# Patient Record
Sex: Female | Born: 1970 | Race: Black or African American | Hispanic: No | Smoking: Former smoker
Health system: Southern US, Community
[De-identification: ages and names within clinical notes are randomized; demographics above are authoritative.]

## PROBLEM LIST (undated history)

## (undated) DIAGNOSIS — M2550 Pain in unspecified joint: Secondary | ICD-10-CM

## (undated) DIAGNOSIS — R109 Unspecified abdominal pain: Secondary | ICD-10-CM

## (undated) DIAGNOSIS — I10 Essential (primary) hypertension: Secondary | ICD-10-CM

## (undated) DIAGNOSIS — M549 Dorsalgia, unspecified: Secondary | ICD-10-CM

## (undated) DIAGNOSIS — F329 Major depressive disorder, single episode, unspecified: Secondary | ICD-10-CM

## (undated) DIAGNOSIS — R12 Heartburn: Secondary | ICD-10-CM

## (undated) DIAGNOSIS — M25569 Pain in unspecified knee: Secondary | ICD-10-CM

## (undated) DIAGNOSIS — R5383 Other fatigue: Secondary | ICD-10-CM

## (undated) DIAGNOSIS — F32A Depression, unspecified: Secondary | ICD-10-CM

## (undated) DIAGNOSIS — F419 Anxiety disorder, unspecified: Secondary | ICD-10-CM

## (undated) DIAGNOSIS — E669 Obesity, unspecified: Secondary | ICD-10-CM

## (undated) HISTORY — DX: Anxiety disorder, unspecified: F41.9

## (undated) HISTORY — DX: Other fatigue: R53.83

## (undated) HISTORY — DX: Dorsalgia, unspecified: M54.9

## (undated) HISTORY — DX: Essential (primary) hypertension: I10

## (undated) HISTORY — DX: Pain in unspecified knee: M25.569

## (undated) HISTORY — DX: Depression, unspecified: F32.A

## (undated) HISTORY — DX: Obesity, unspecified: E66.9

## (undated) HISTORY — PX: NO PAST SURGERIES: SHX2092

## (undated) HISTORY — DX: Unspecified abdominal pain: R10.9

## (undated) HISTORY — DX: Pain in unspecified joint: M25.50

## (undated) HISTORY — DX: Heartburn: R12

---
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
## 1998-05-05 ENCOUNTER — Emergency Department (HOSPITAL_COMMUNITY): Admission: EM | Admit: 1998-05-05 | Discharge: 1998-05-05 | Payer: Self-pay | Admitting: Emergency Medicine

## 1998-07-06 ENCOUNTER — Emergency Department (HOSPITAL_COMMUNITY): Admission: EM | Admit: 1998-07-06 | Discharge: 1998-07-06 | Payer: Self-pay | Admitting: Emergency Medicine

## 2000-07-18 ENCOUNTER — Emergency Department (HOSPITAL_COMMUNITY): Admission: EM | Admit: 2000-07-18 | Discharge: 2000-07-18 | Payer: Self-pay | Admitting: Emergency Medicine

## 2002-01-16 ENCOUNTER — Other Ambulatory Visit: Admission: RE | Admit: 2002-01-16 | Discharge: 2002-01-16 | Payer: Self-pay | Admitting: *Deleted

## 2002-01-16 ENCOUNTER — Encounter (INDEPENDENT_AMBULATORY_CARE_PROVIDER_SITE_OTHER): Payer: Self-pay | Admitting: *Deleted

## 2002-01-16 ENCOUNTER — Encounter: Admission: RE | Admit: 2002-01-16 | Discharge: 2002-01-16 | Payer: Self-pay | Admitting: *Deleted

## 2002-02-06 ENCOUNTER — Encounter: Admission: RE | Admit: 2002-02-06 | Discharge: 2002-02-06 | Payer: Self-pay | Admitting: *Deleted

## 2004-03-11 ENCOUNTER — Other Ambulatory Visit: Admission: RE | Admit: 2004-03-11 | Discharge: 2004-03-11 | Payer: Self-pay | Admitting: Obstetrics and Gynecology

## 2004-04-07 ENCOUNTER — Ambulatory Visit (HOSPITAL_COMMUNITY): Admission: RE | Admit: 2004-04-07 | Discharge: 2004-04-07 | Payer: Self-pay | Admitting: Obstetrics and Gynecology

## 2004-05-07 ENCOUNTER — Ambulatory Visit (HOSPITAL_COMMUNITY): Admission: RE | Admit: 2004-05-07 | Discharge: 2004-05-07 | Payer: Self-pay | Admitting: Obstetrics and Gynecology

## 2004-07-16 ENCOUNTER — Ambulatory Visit (HOSPITAL_COMMUNITY): Admission: RE | Admit: 2004-07-16 | Discharge: 2004-07-16 | Payer: Self-pay | Admitting: Obstetrics and Gynecology

## 2004-08-06 ENCOUNTER — Ambulatory Visit (HOSPITAL_COMMUNITY): Admission: RE | Admit: 2004-08-06 | Discharge: 2004-08-06 | Payer: Self-pay | Admitting: Obstetrics and Gynecology

## 2004-08-11 ENCOUNTER — Ambulatory Visit: Payer: Self-pay | Admitting: Family Medicine

## 2004-08-14 ENCOUNTER — Ambulatory Visit: Payer: Self-pay | Admitting: Obstetrics & Gynecology

## 2004-08-18 ENCOUNTER — Ambulatory Visit: Payer: Self-pay | Admitting: *Deleted

## 2004-08-21 ENCOUNTER — Ambulatory Visit: Payer: Self-pay | Admitting: Obstetrics & Gynecology

## 2004-08-25 ENCOUNTER — Ambulatory Visit: Payer: Self-pay | Admitting: *Deleted

## 2004-08-27 ENCOUNTER — Ambulatory Visit (HOSPITAL_COMMUNITY): Admission: RE | Admit: 2004-08-27 | Discharge: 2004-08-27 | Payer: Self-pay | Admitting: Obstetrics and Gynecology

## 2004-09-01 ENCOUNTER — Ambulatory Visit: Payer: Self-pay | Admitting: *Deleted

## 2004-09-03 ENCOUNTER — Ambulatory Visit: Payer: Self-pay | Admitting: Obstetrics & Gynecology

## 2004-09-10 ENCOUNTER — Ambulatory Visit: Payer: Self-pay | Admitting: Obstetrics & Gynecology

## 2004-09-13 ENCOUNTER — Inpatient Hospital Stay (HOSPITAL_COMMUNITY): Admission: AD | Admit: 2004-09-13 | Discharge: 2004-09-19 | Payer: Self-pay | Admitting: Obstetrics and Gynecology

## 2004-09-16 ENCOUNTER — Encounter (INDEPENDENT_AMBULATORY_CARE_PROVIDER_SITE_OTHER): Payer: Self-pay | Admitting: Specialist

## 2005-10-13 IMAGING — US US OB COMP +14 WK
1 series · 13 of 28 positions shown · non-contrast
Comparison: none

CLINICAL DATA: Second trimester bleeding.

[Series 1: us ob comp +14 wk · 0.31mm/px · 13 of 47 slices shown]
[im 2/47]
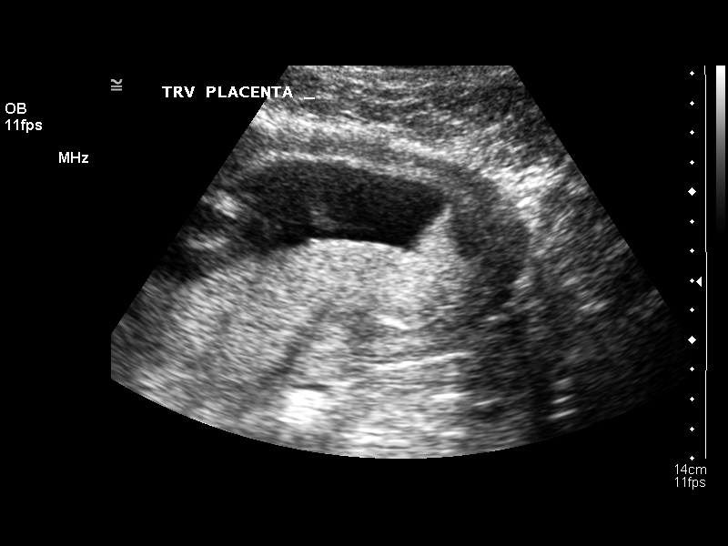
[im 6/47]
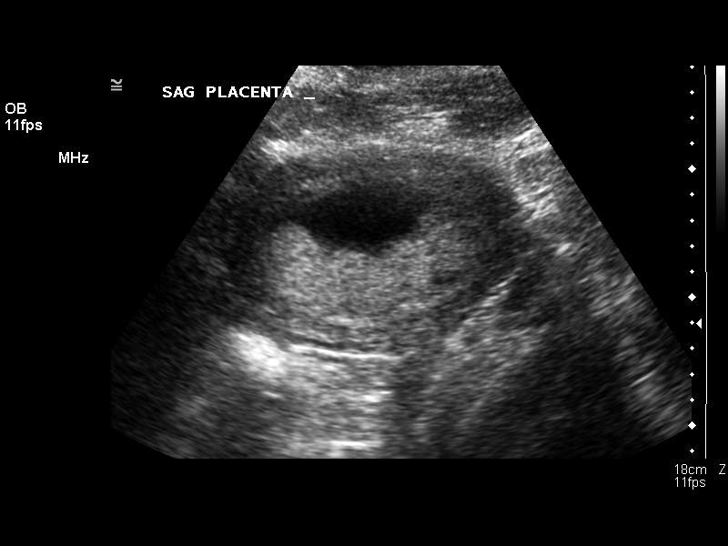
[im 9/47]
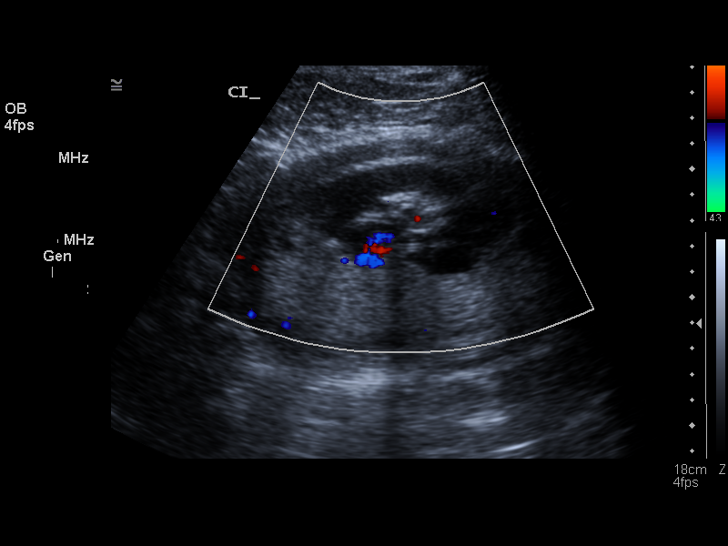
[im 12/47]
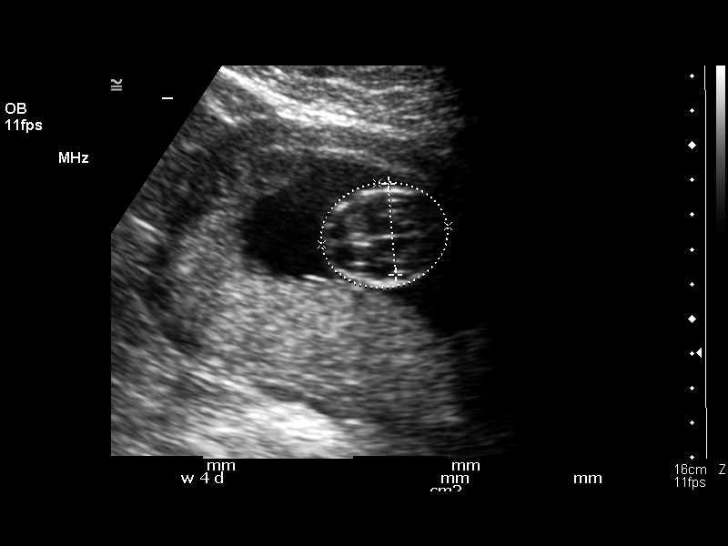
[im 16/47]
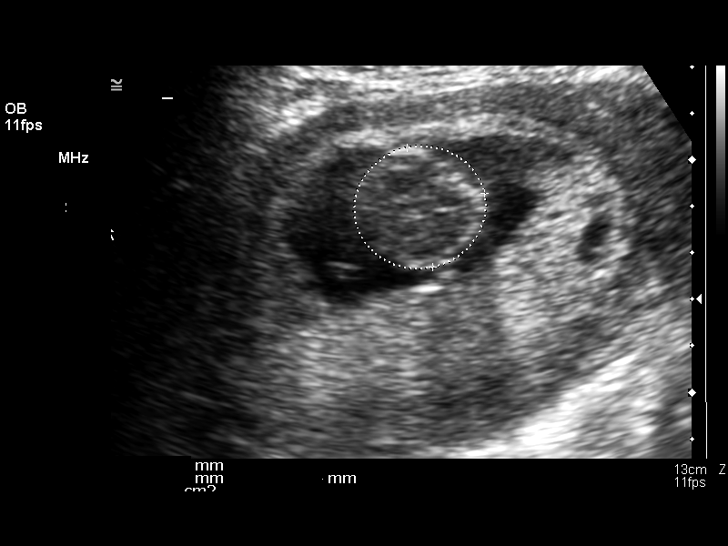
[im 19/47]
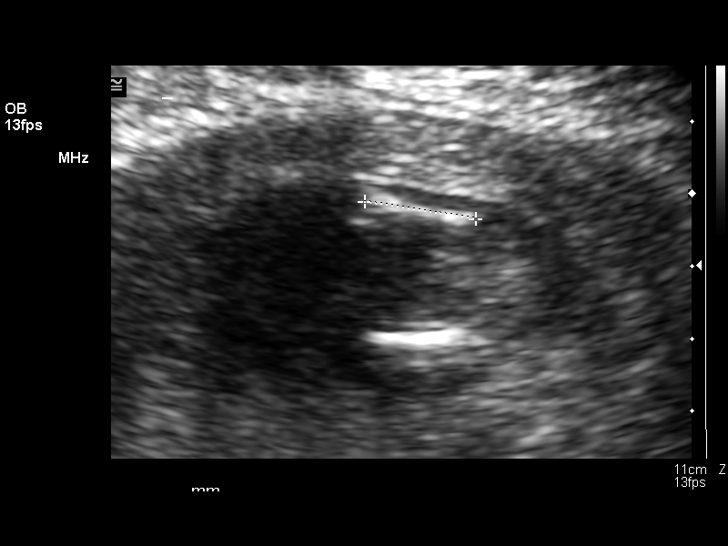
[im 24/47]
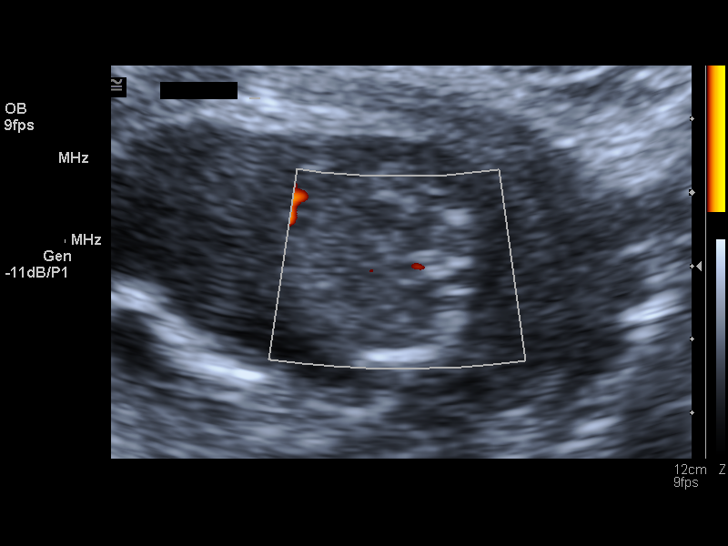
[im 28/47]
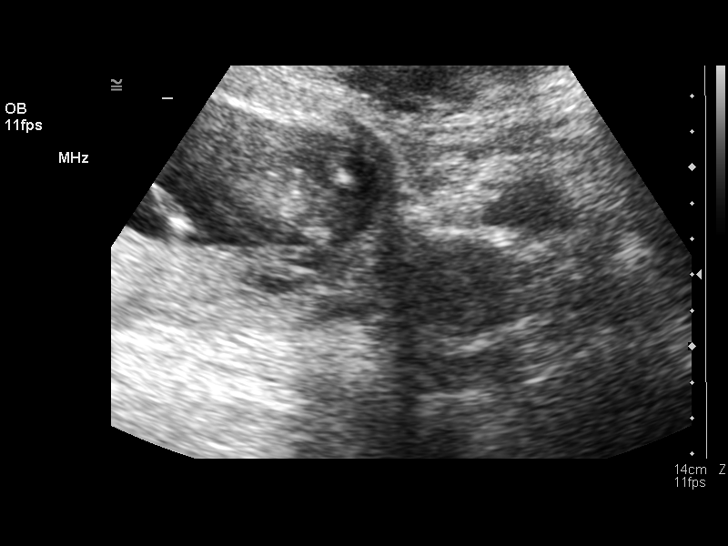
[im 31/47]
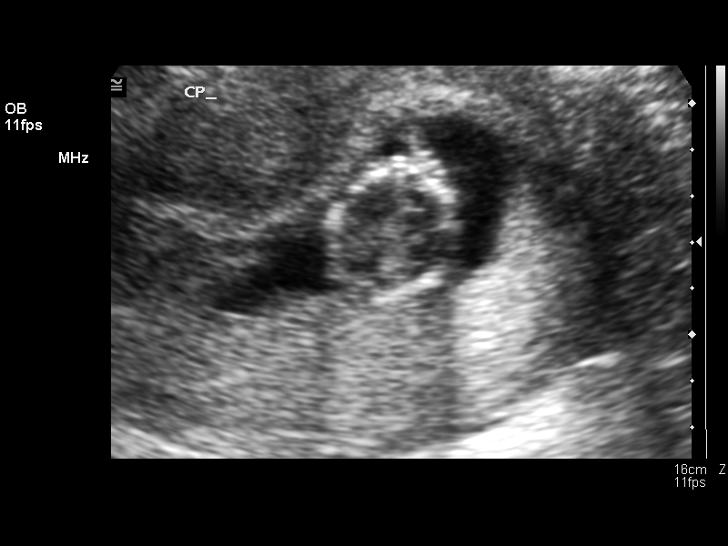
[im 35/47]
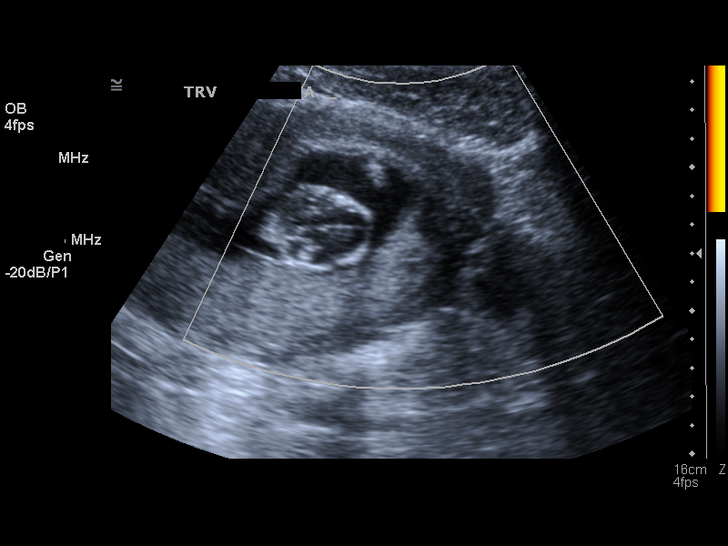
[im 38/47]
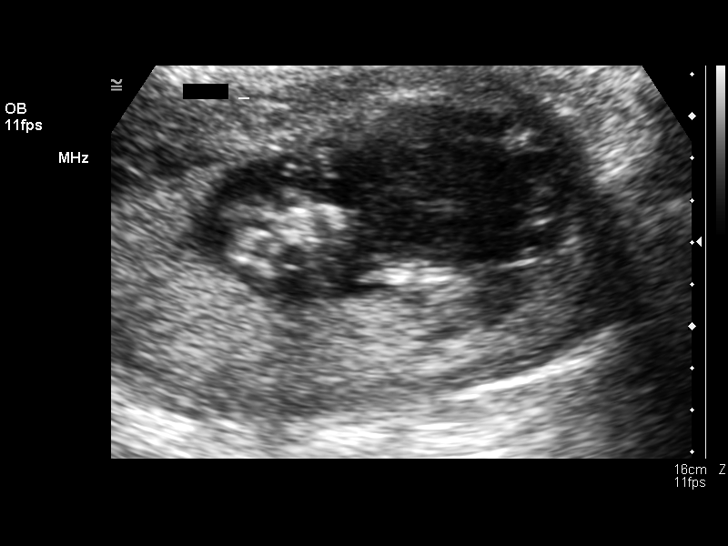
[im 41/47]
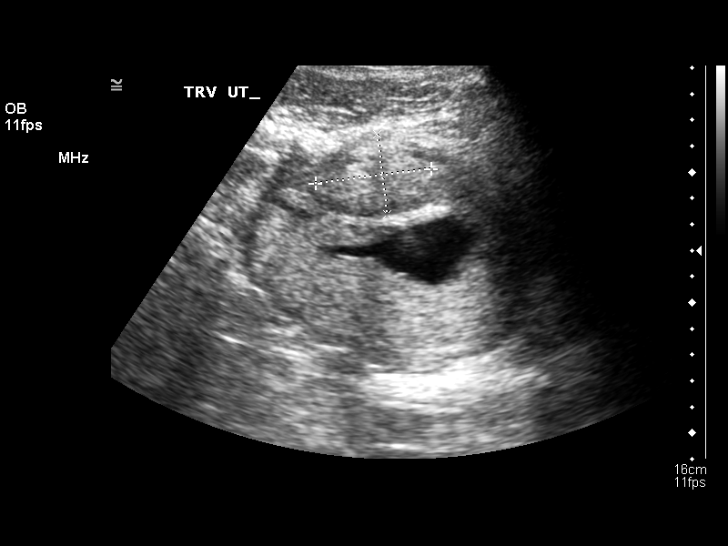
[im 45/47]
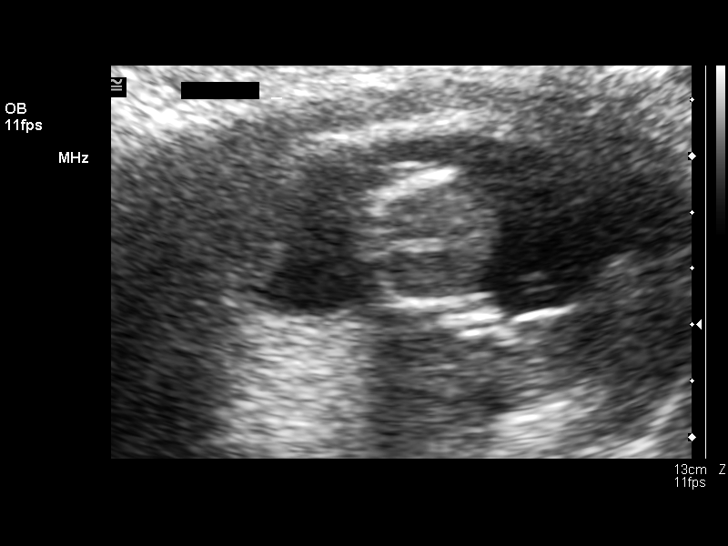

[13 of 28 positions shown; findings below may reference images not displayed]

OBSTETRICAL ULTRASOUND
 Number of Fetuses: 1
 Heart Rate:  157
 Movement:  Yes
 Breathing:  No  
 Presentation:  Transverse with head maternal right
 Placental Location:  Posterior
 Grade:  I
 Amniotic Fluid (Subjective):  Normal
 Amniotic Fluid (Objective):   4.2 cm Vertical pocket 

 FETAL BIOMETRY
 BPD:   2.6 cm   14 w 4 d
 HC:   10.6 cm   15 w 0 d
 AC:   9.0 cm   15 w 1 d
 FL:    1.5 cm   14 w 4 d

 MEAN GA:  14 w 6 d

 FETAL ANATOMY
 Lateral Ventricles:    Not visualized 
 Thalami/CSP:      Not visualized 
 Posterior Fossa:  Not visualized 
 Nuchal Region:    Not visualized 
 Spine:      Not visualized 
 4 Chamber Heart on Left:      Not visualized 
 Stomach on Left:      Visualized 
 3 Vessel Cord:    Not visualized 
 Cord Insertion site:    Not visualized 
 Kidneys:  Visualized 
 Bladder:  Visualized 
 Extremities:      Not visualized 

 ADDITIONAL ANATOMY VISUALIZED:  Diaphragm.

 Evaluation limited by:  Maternal habitus

 MATERNAL FINDINGS
 Cervix:   3.9 cm Transabdominally.  
 Anterior fibroid measuring 4.5 x 3.1 x 3.9 cm
IMPRESSION: Single living intrauterine gestation with subjectively normal amniotic fluid volume.  The mean estimated gestational age by fetal biometry is 14 weeks 6 days which correlates closely with the reported assigned gestational age by first ultrasound.  
 Visualized fetal anatomy is limited by maternal habitus and early gestational age.  
 Small hypoechoic focus noted at the inferior margin of the placenta is nonspecific, but may be related to a small marginal hematoma.  
 4.5 cm apparent fibroid in the anterior uterine body.  
 Repeat ultrasound exam in 3 ? 4 weeks is recommended for more complete characterization of fetal anatomy.  

 </u12:p>

## 2006-01-21 IMAGING — US US OB FOLLOW-UP
1 series · 13 of 28 positions shown · non-contrast
Comparison: none

CLINICAL DATA: Assess growth.

[Series 1: us ob follow-up · 0.27mm/px · 13 of 49 slices shown]
[im 2/49]
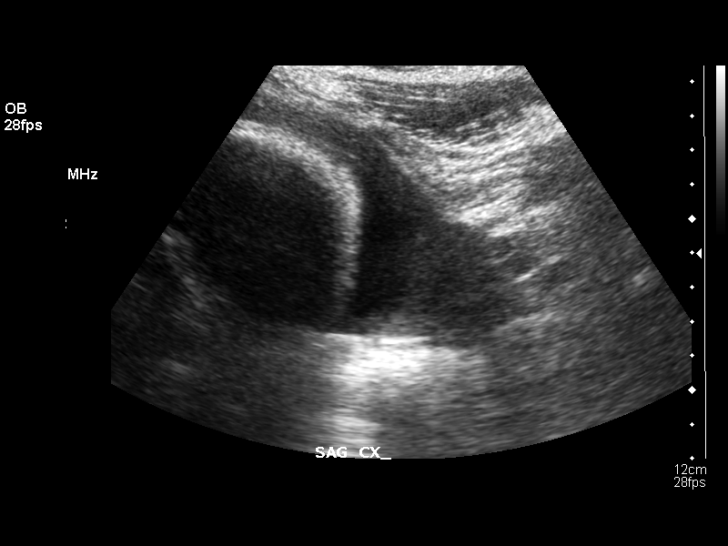
[im 6/49]
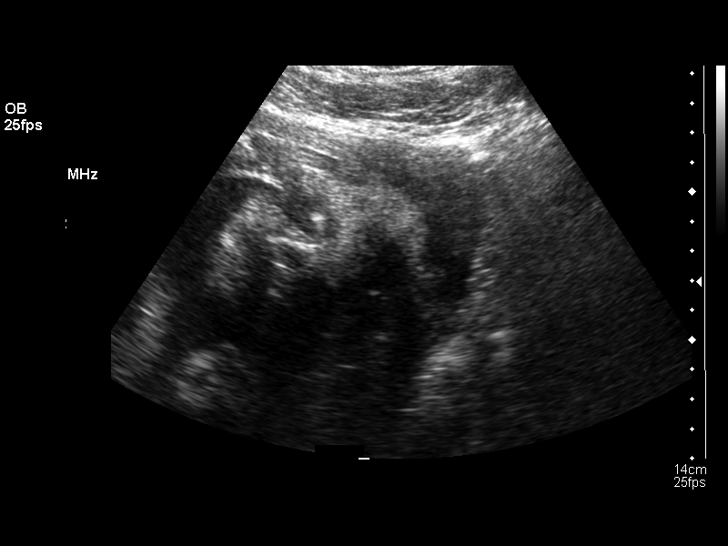
[im 9/49]
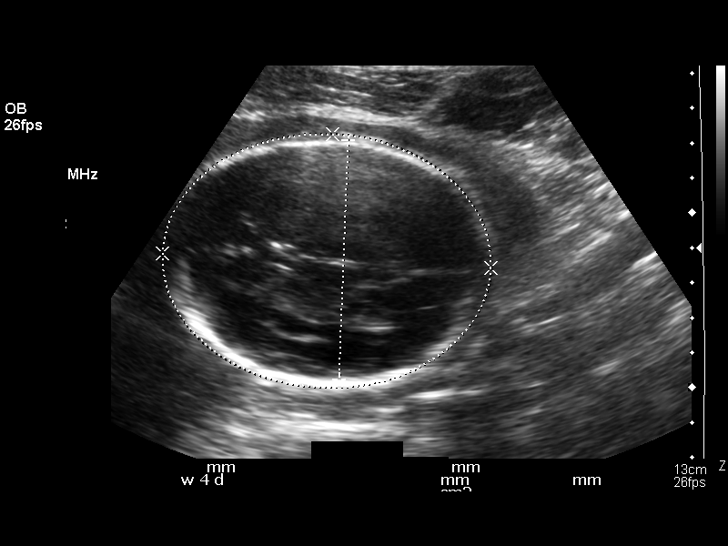
[im 13/49]
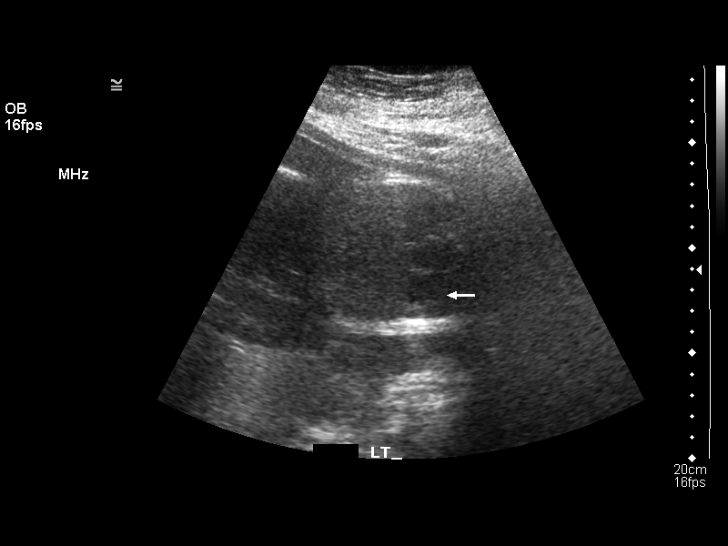
[im 17/49]
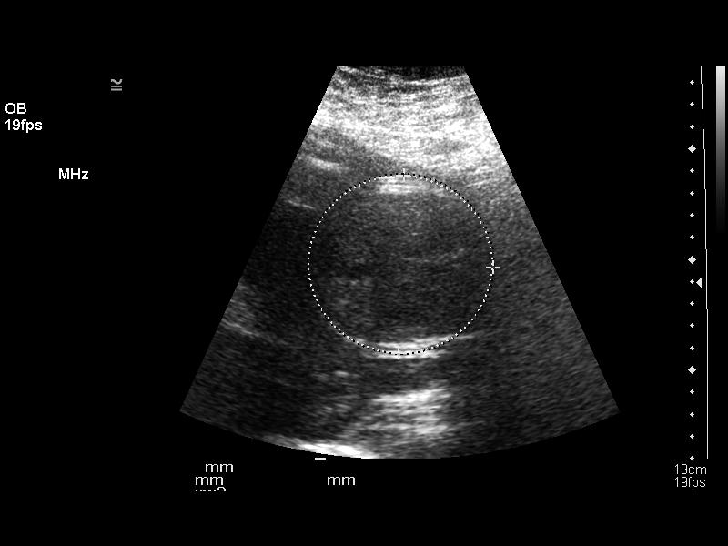
[im 20/49]
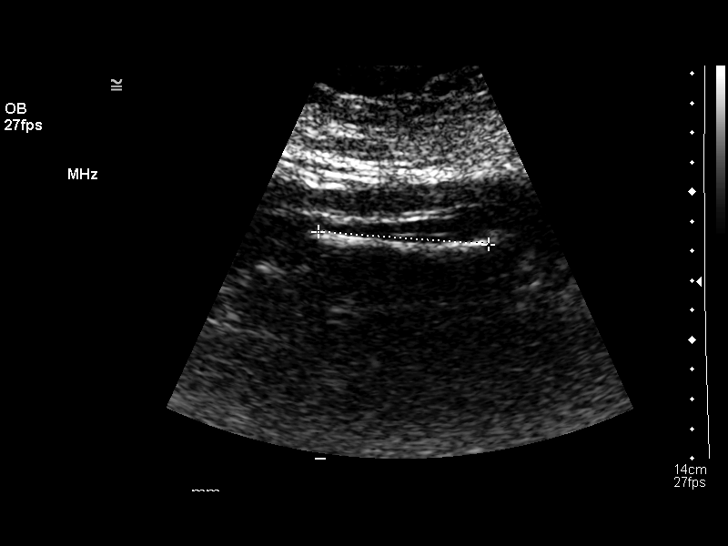
[im 25/49]
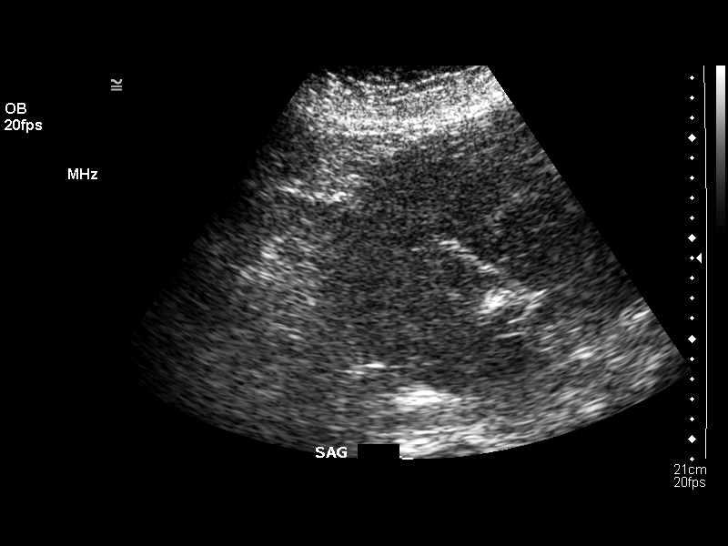
[im 29/49]
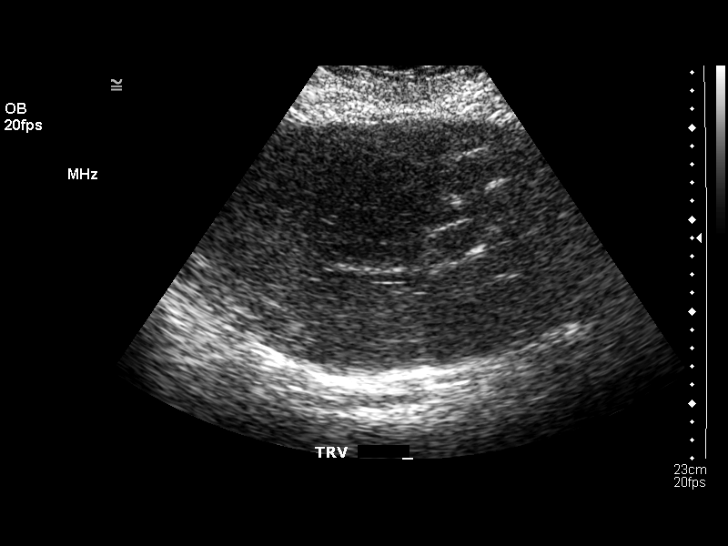
[im 33/49]
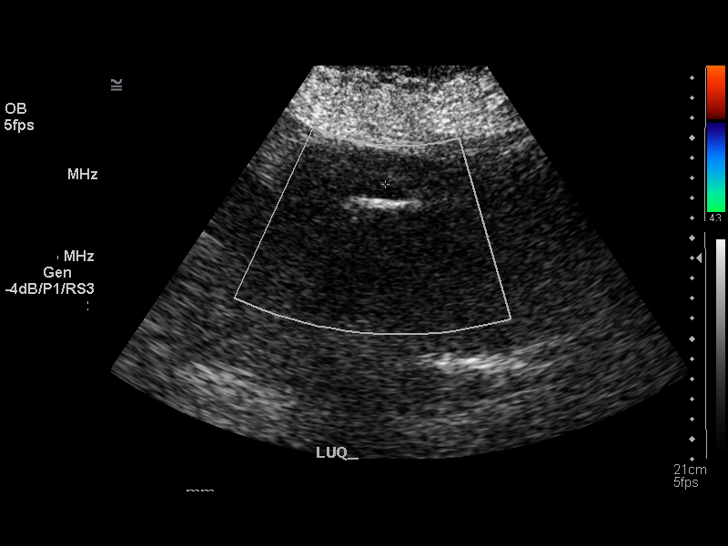
[im 36/49]
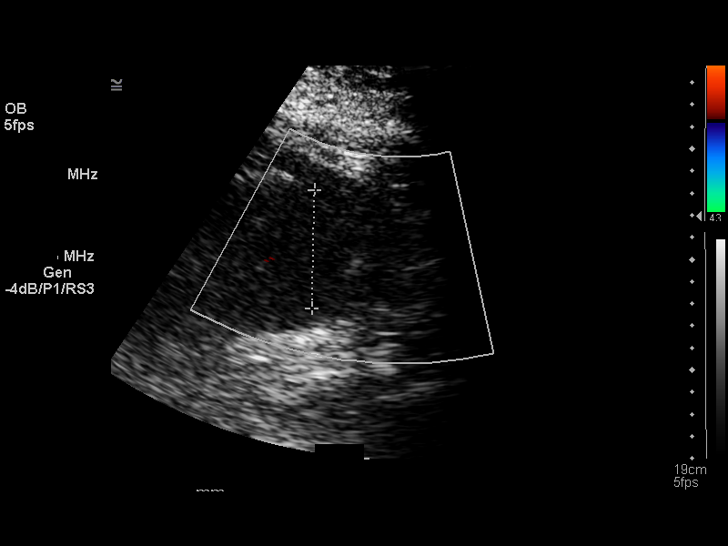
[im 40/49]
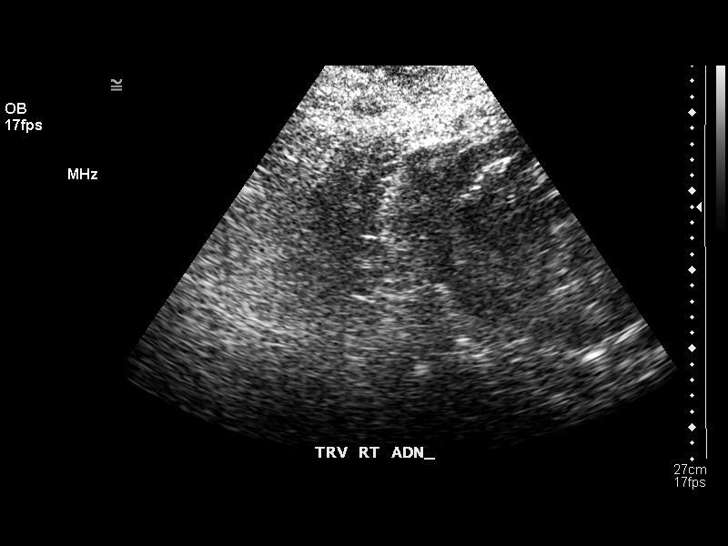
[im 43/49]
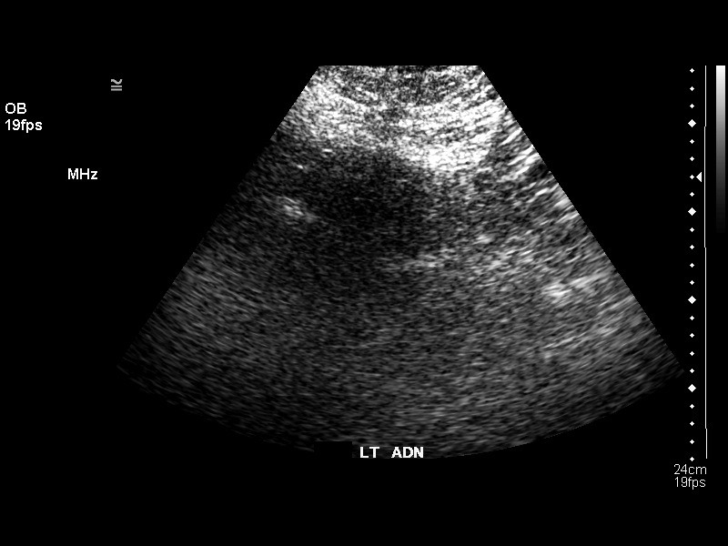
[im 47/49]
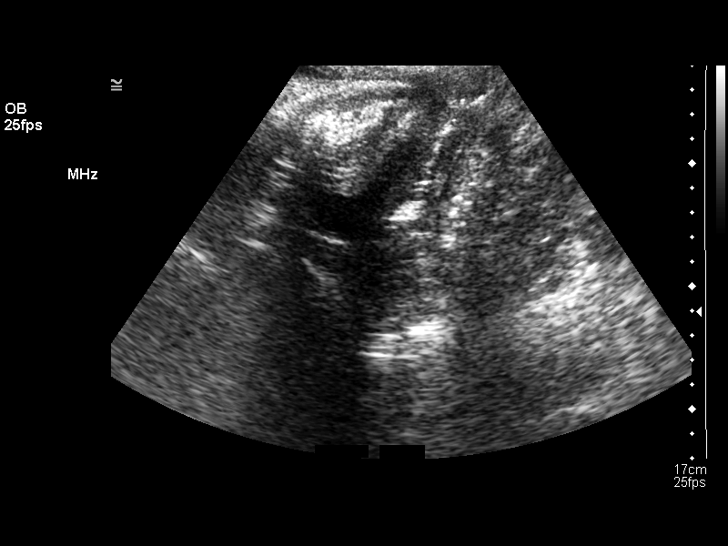

[13 of 28 positions shown; findings below may reference images not displayed]

OBSTETRICAL ULTRASOUND RE-EVALUATION:
Number of Fetuses:  1
Heart Rate:  141
Movement:  Yes
Breathing:  Yes
Presentation:  Cephalic
Placental Location:  Posterior
Grade:  I
Previa: No
Amniotic Fluid (subjective):  Normal
Amniotic Fluid (objective):  15.7 cm AFI (5th -95th%ile = 9.2 ? 23.1 cm for 29 wks)

FETAL BIOMETRY
BPD:  6.9 cm   27 w 5 d
HC:  26.5 cm   28 w 6 d
AC:  25.7 cm   29 w 6 d
FL:  5.7 cm   29 w 6 d

Mean GA:  29 w 1 d
Assigned GA:  29 w 0 d
Fetal indices are within normal limits.
EFW:  8212 g (H) 75th ? 90th%ile (6255 ? 6014 g) For 29 wks

FETAL ANATOMY
Lateral Ventricles:  Visualized 
Thalami/CSP:  Previously seen 
Posterior Fossa:  Visualized 
Nuchal Region:  Previously seen 
Spine:  Previously seen 
4 Chamber Heart on Left:  Not visualized 
Stomach on Left:  Visualized 
3 Vessel Cord:    Previously seen 
Cord Insertion Site:  Previously seen 
Kidneys:    Not visualized 
Bladder:  Visualized 
Extremities:  Previously seen 

Evaluation limited by:  Maternal habitus and fetal position.

  MATERNAL UTERINE AND ADNEXAL FINDINGS
Cervix:  4.8 cm Translabially
IMPRESSION: Single intrauterine pregnancy demonstrating an estimated gestational age by ultrasound of 29 weeks and 1 day.  Correlation with assigned age by initial ultrasound of 29 weeks and 0 days suggests appropriate growth.  Currently the estimated fetal weight is between the 75th and 95th percentile for a 29 week gestation and continued close follow-up for growth is recommended.
Subjectively and quantitatively normal amniotic fluid volume and normal cervical length.
No late developing fetal anatomic abnormalities are identified associated with the stomach, bladder or lateral ventricles.  A four chamber heart view and the kidneys could not be visualized today due to fetal lie combined with maternal body habitus.

## 2006-03-21 IMAGING — US US FETAL BPP W/O NONSTRESS
1 series · 14 of 17 positions shown · non-contrast
Comparison: none

CLINICAL DATA: 37 weeks pregnant.  Hypertension.

[Series 1: us fetal bpp w/o nonstress · 0.39mm/px · 14 of 17 slices shown]
[im 1/17]
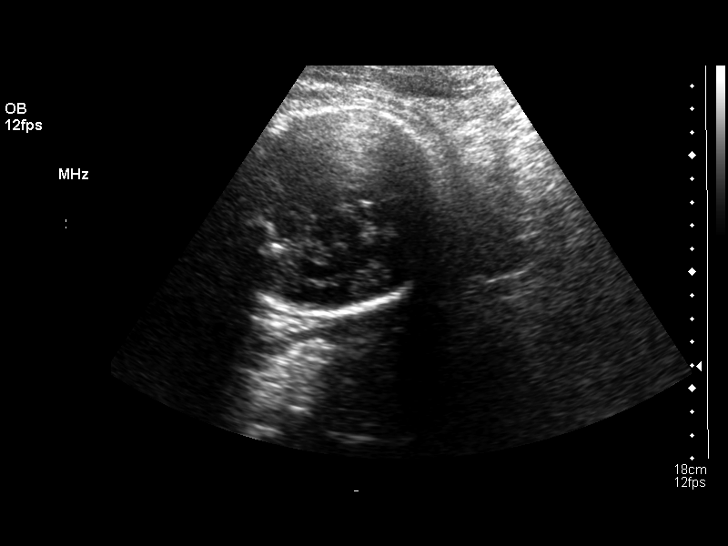
[im 2/17]
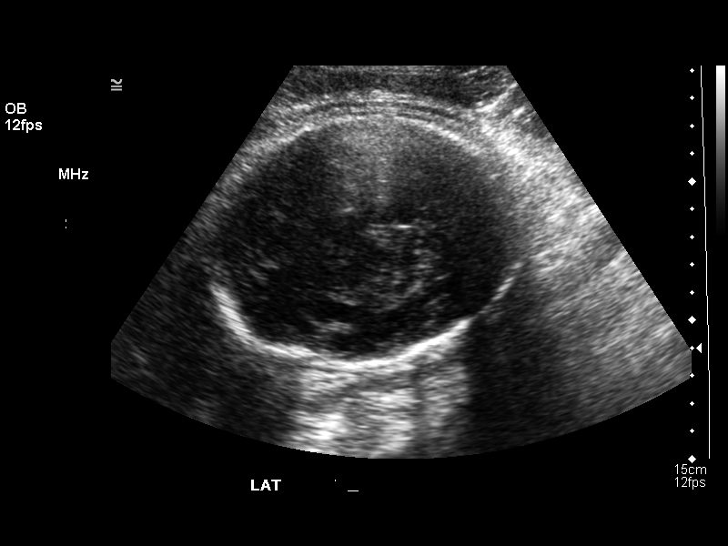
[im 4/17]
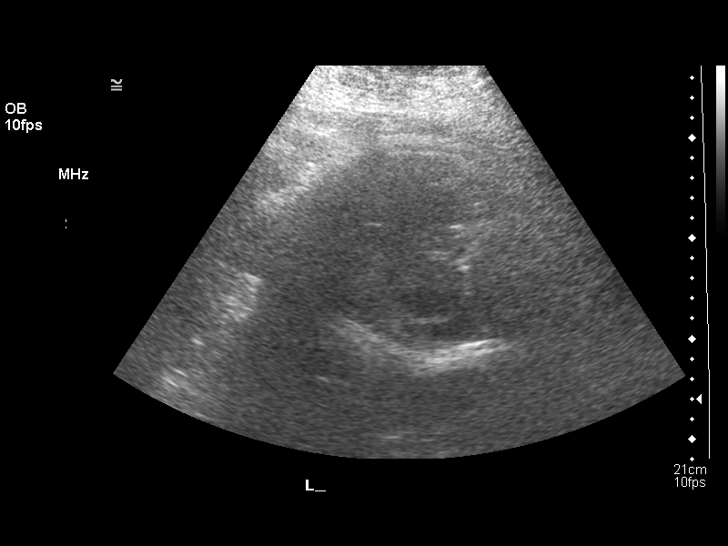
[im 5/17]
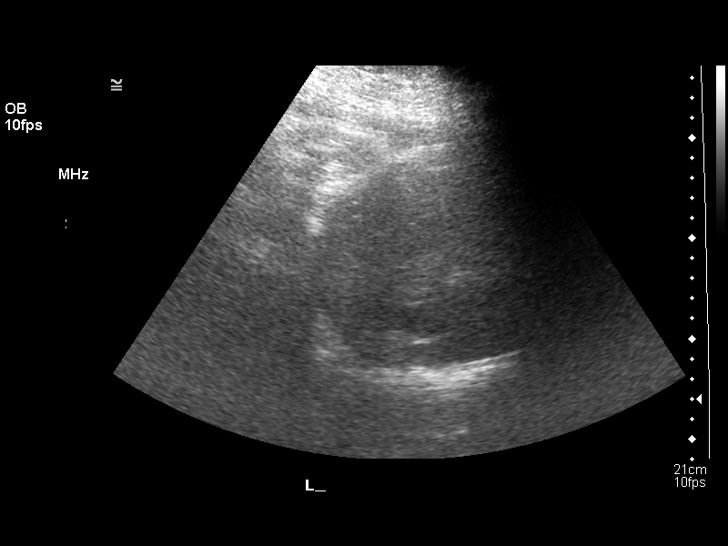
[im 6/17]
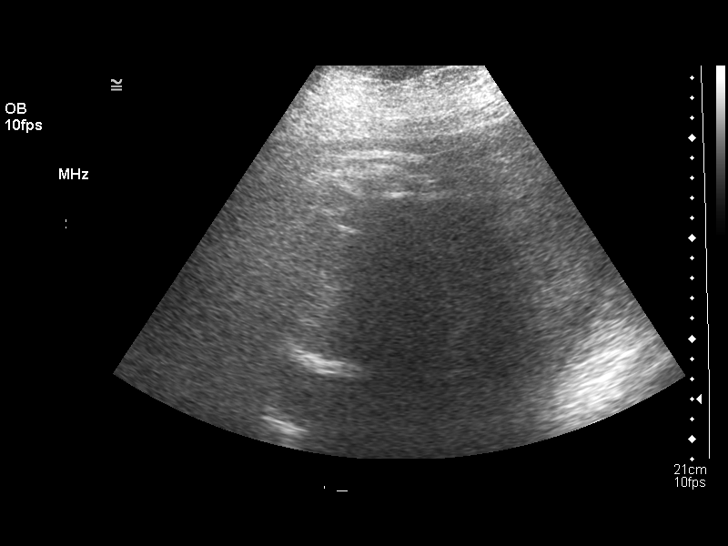
[im 7/17]
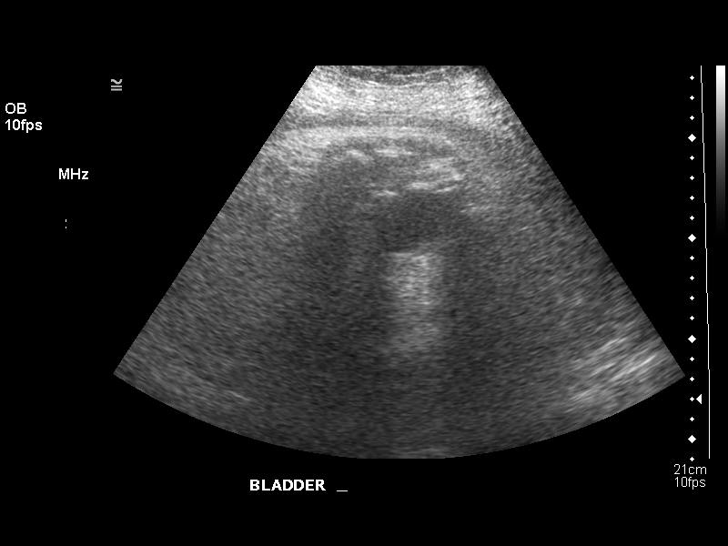
[im 8/17]
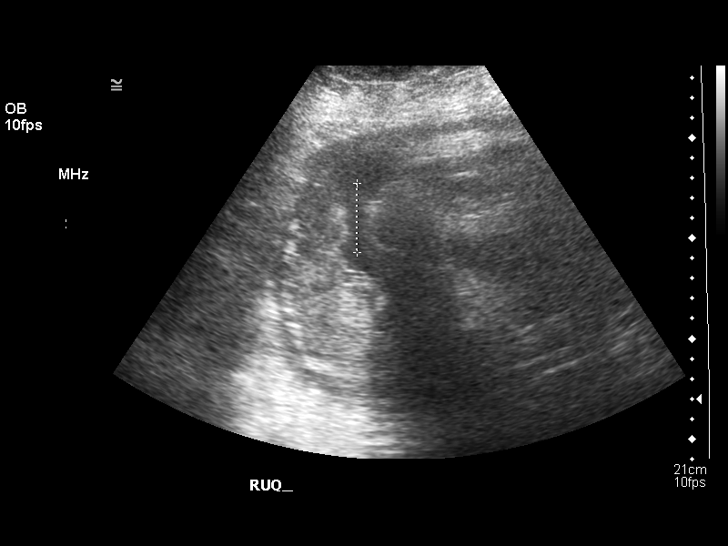
[im 10/17]
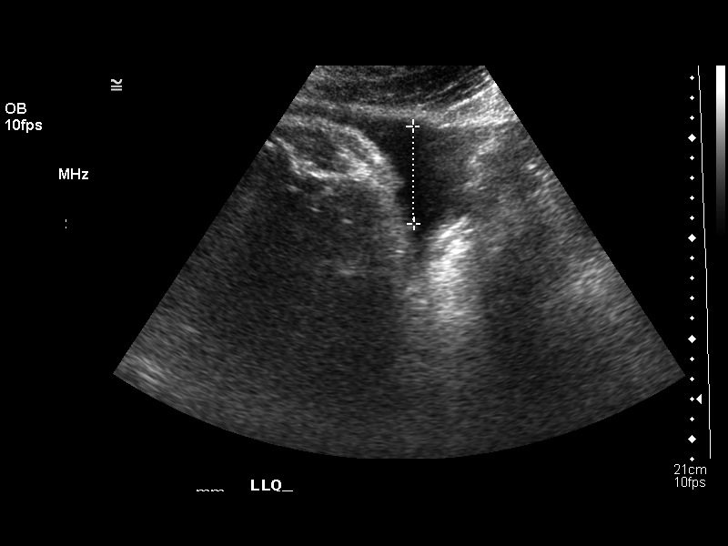
[im 11/17]
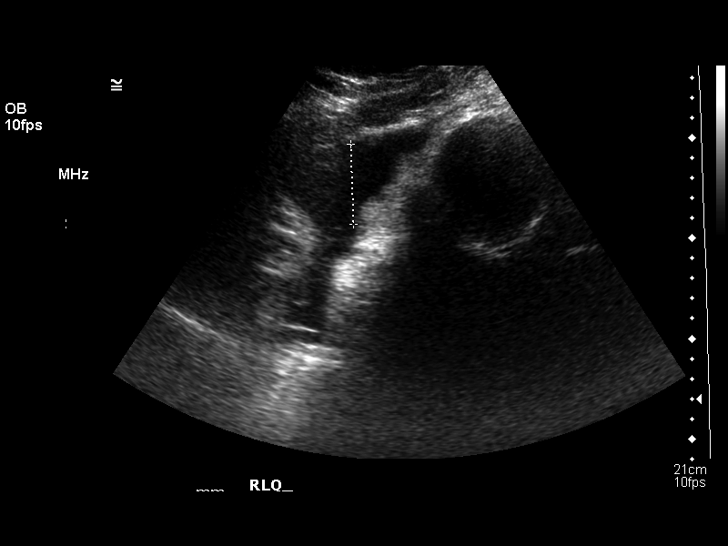
[im 12/17]
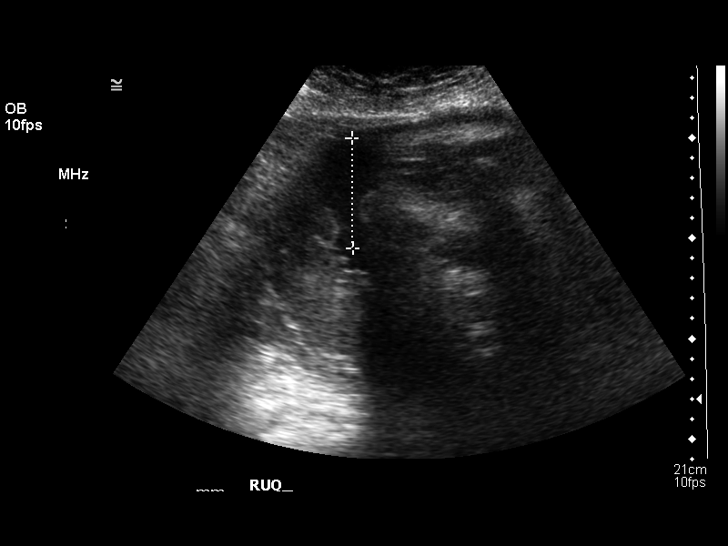
[im 13/17]
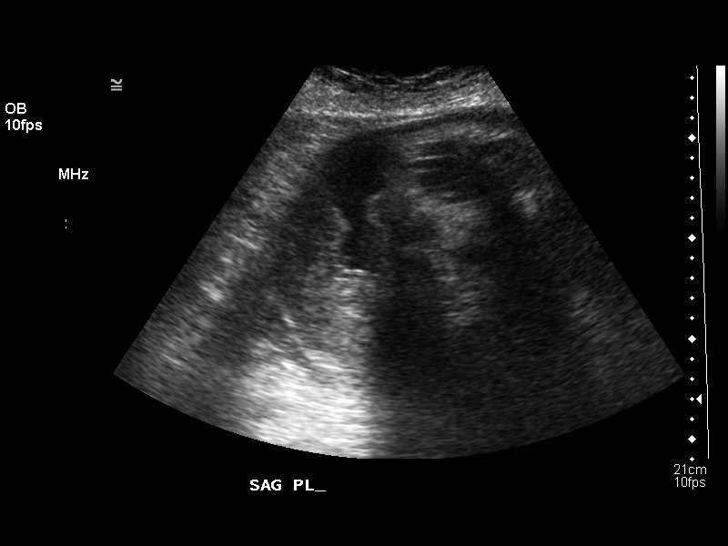
[im 14/17]
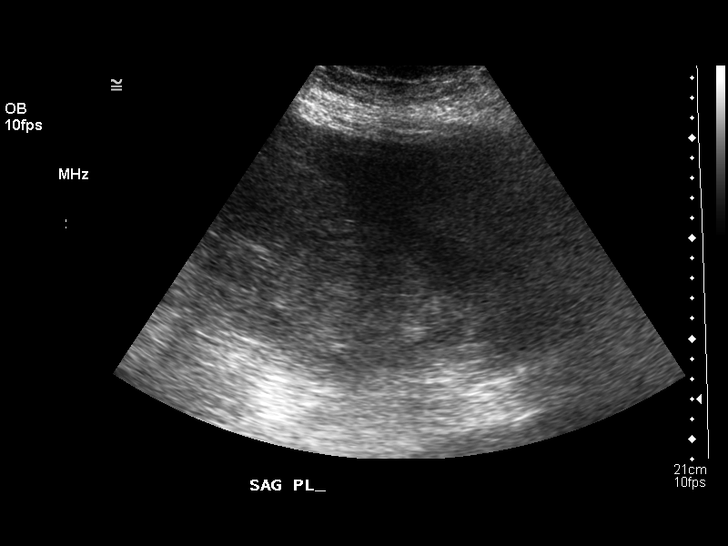
[im 16/17]
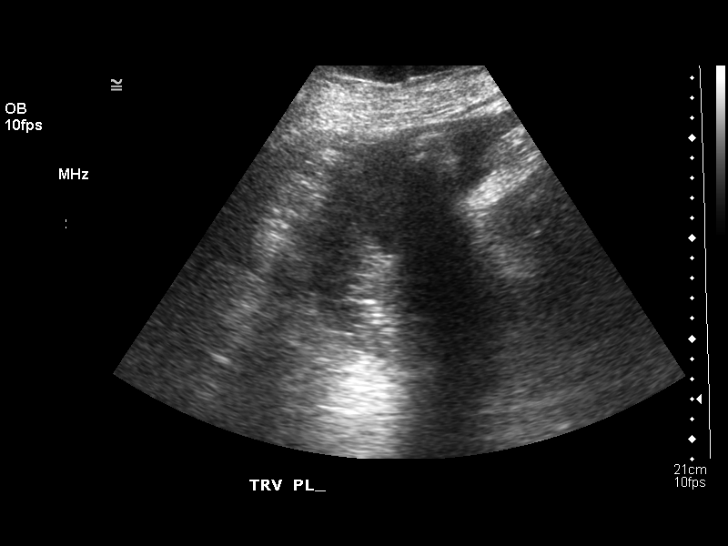
[im 17/17]
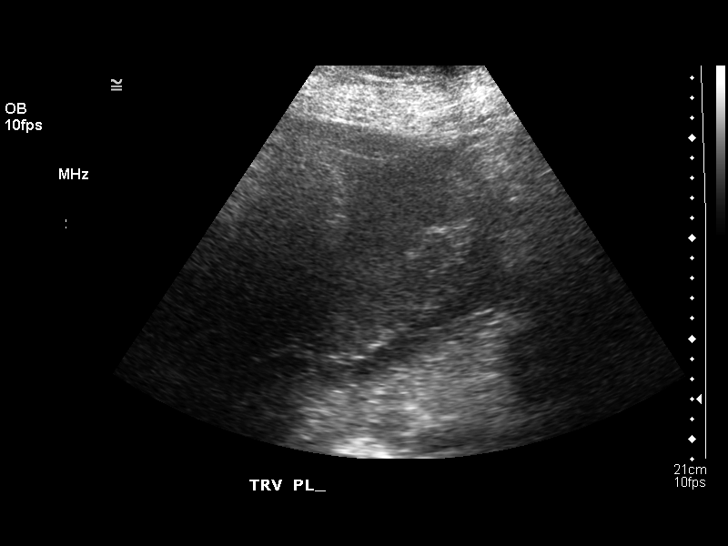

[14 of 17 positions shown; findings below may reference images not displayed]

BIOPHYSICAL PROFILE

 Number of Fetuses:  1
 Heart rate:  139
 Movement:  Yes
 Breathing:  Yes
 Presentation:  Cephalic
 Placental Location:  Posterior
 Grade:  II
 Previa:  No
 Amniotic Fluid (Subjective):  Normal
 Amniotic Fluid (Objective):  18.6 cm AFI 

 Fetal measurements and complete anatomic evaluation were not requested.  The following fetal anatomy was visualized on this exam:  Lateral ventricles, stomach, kidneys and bladder. 

 BPP SCORING
 Movements: 2  Time:  10 minutes
 Breathing:  2
 Tone:  2
 Amniotic Fluid:  2
 Total Score:  8

 MATERNAL UTERINE AND ADNEXAL FINDINGS
 Cervix:  Not evaluated
IMPRESSION: 1.  Single fetus in cephalic position.
 2.  BPP score [DATE] over 10 minutes.

## 2009-08-08 ENCOUNTER — Emergency Department (HOSPITAL_COMMUNITY): Admission: EM | Admit: 2009-08-08 | Discharge: 2009-08-08 | Payer: Self-pay | Admitting: Emergency Medicine

## 2009-09-18 ENCOUNTER — Other Ambulatory Visit: Admission: RE | Admit: 2009-09-18 | Discharge: 2009-09-18 | Payer: Self-pay | Admitting: Internal Medicine

## 2010-09-05 ENCOUNTER — Encounter: Payer: Self-pay | Admitting: Obstetrics and Gynecology

## 2010-09-06 ENCOUNTER — Encounter: Payer: Self-pay | Admitting: Obstetrics and Gynecology

## 2010-11-16 LAB — RAPID STREP SCREEN (MED CTR MEBANE ONLY): Streptococcus, Group A Screen (Direct): NEGATIVE

## 2011-01-01 NOTE — Discharge Summary (Signed)
[name], [name]               ACCOUNT NO.:  [id number]   MEDICAL RECORD NO.:  [id number]          PATIENT TYPE:  INP   LOCATION:  9125                          FACILITY:  [facility]   PHYSICIAN:  Osborn Coho, M.D.   DATE OF BIRTH:  02-09-1971   DATE OF ADMISSION:  09/13/2004  DATE OF DISCHARGE:  09/19/2004                                 DISCHARGE SUMMARY   ADMISSION DIAGNOSES:  1.  Intrauterine pregnancy at 37-3/7 weeks.  2.  Pre-eclampsia.  3.  Morbid obesity.  4.  Positive group B Streptococcus.   DISCHARGE DIAGNOSES:  1.  Term pregnancy.  2.  Failure to progress in labor.  3.  Cephalopelvic disproportion.  4.  Pre-eclampsia.  5.  Obesity.  6.  Fibroid uterus.   HOSPITAL COURSE:  [name] is a 40 year old, gravida 2, para 0-0-1-0 at 38-  3/7 weeks who was admitted on September 13, 2004 with elevated blood pressure  and to return a 24 hours' urine.  Her pregnancy had been remarkable for:   1.  Unsure dates.  2.  Previous smoker.  3.  First trimester dental x-ray.  4.  History of hypertension.  5.  Positive group B Streptococcus.  6.  Morbid obesity with weight greater than 350 pounds.   On arrival, blood pressures were in the 70's to 90's diastolic.  BPP was  normal.  AFI was normal.  Her 24 hours urine showed 550 mg of protein in a  24 hours' specimen meeting the criteria for pre-eclampsia.  She was started  on magnesium sulfate.  PIH labs were done and Cervidil was placed for  induction.  The cervix at that time was long and closed.  Over the next 24  hours, the cervix did improve status by the morning of September 14, 2004.  The cervix was 1-2, 70-80%.  PIH labs were normal.  Blood pressures were  normal.  By the end of the day on September 14, 2004 no cervical change had  occurred.  Pitocin was discontinued and Cervidil was repeated.  Cervidil was  maintained through the night.  It was removed the next morning and Pitocin  was again started.  The cervix remained the  same.  By the afternoon, the  cervix was 2, 70%, vertex at a -3 station.  An IU PC was placed.  __________  were noted to be 200.  By the morning of September 16, 2004 the cervix  remained the same.  A scalp lead was placed.  The patient was offered a  cesarean section for lack of progress in labor.  This was performed by Dr.  Stefano Wagner on September 16, 2004 under spinal anesthesia.  The findings were a  viable female by the name of [name], weight 7 pounds 12 ounces, Apgar's were  8 and 9, there was a 1 cm fundal fibroid and a nuchal cord was noted.  The  patient was taken to the recovery room in good condition.  The infant was  taken to the Full Term Nursery.  The patient was maintained on magnesium  until day #2 postpartum.  It was discontinued in that afternoon on  postoperative day #2 on September 18, 2004.  Pressures were within normal  limits.  J-P drain had a small amount of serosanguineous drainage.  The  patient's labs were within normal limits with hemoglobin of 12.3, platelets  of 172, normal LFT's, and metabolic panel.  By postoperative day #3, the  patient was doing well.  She was up, ad lib.  Her J-P drain was removed.  She was bottle feeding and planned oral contraceptives.  Her pressures were  in the 120's to 150's over 80's to low 90's.  Other vital signs were stable.  Abdomen was noted to have significant panniculus but the incision was clean,  dry, and intact with subcuticular sutures removed.  Extremities still noted  1-2+ edema.  The patient was deemed to have received full benefit of her  hospital stay and was discharged home.   CONDITION ON DISCHARGE:  Stable.   DISCHARGE INSTRUCTIONS:  Per [facility] handout with PIH  precautions noted.   DISCHARGE MEDICATIONS:  1.  Motrin 600 mg p.o. q.6h. p.r.n. pain.  2.  Tylox 1-2 p.o. q.3-4h. p.r.n. pain.  3.  Hydrochlorothiazide 25 mg one p.o. q day was prescribed for the patient      per consult with Dr. Su Hilt.    DISCHARGE FOLLOW UP:  Will occur in one week at [facility] for  blood pressure check and then at six weeks postpartum or p.r.n.   Dictated by Renaldo Reel. Emilee Hero, C.N.M. for Osborn Coho, M.D.      VLL/MEDQ  D:  09/19/2004  T:  09/19/2004  Job:  [id number]

## 2011-01-01 NOTE — H&P (Signed)
NAMEGAYL, IVANOFF               ACCOUNT NO.:  [id number]   MEDICAL RECORD NO.:  [id number]          PATIENT TYPE:  WOC   LOCATION:  WOC                          FACILITY:  [facility]   PHYSICIAN:  Naima A. Dillard, M.D. DATE OF BIRTH:  1971/05/06   DATE OF ADMISSION:  DATE OF DISCHARGE:                                HISTORY & PHYSICAL   Ms. Garza is a 40 year old gravida 2 para 0-0-1-0 at 37-3/7 weeks.  EDD  October 01, 2004.  She presents with elevated blood pressure and to return  a completed 24-hour urine specimen.  She reports no contractions, cramping,  or pain, no vaginal bleeding or rupture of membranes.  She denies any PIH  symptoms.  No headache, visual changes, or epigastric pain.  Her pregnancy  has been followed by the M.D. service at [facility] and is remarkable for:  1.  Unsure dates.  2. Previous smoker.  3. First trimester dental x-ray.  4.  History of hypertension.  5. Positive group B strep.  6. Morbid obesity,  greater than 350 pounds.   This patient entered prenatal care at the office of [facility] on February 26, 2004 at  approximately [redacted] weeks gestation.  EDC/determined by ultrasound and confirmed  with followup.  This patient's prenatal course has been essentially  unremarkable.  She began antenatal testing at 32 weeks with ultrasound and  NSTs.  BPPs with ultrasounds secondary to her size and history of  hypertension.  Her blood pressures, though, have been in good control  throughout her pregnancy until the present time.  On August 28, 2004, an  ultrasound showed estimated fetal weight 2675 grams at the 75-90th  percentile, AFI normal vertex, and her BPP was 8/8.  On today's 24-hour  urine specimen, she was noted to be proteinuric with a diagnosis of  preeclampsia.  Prenatal lab work on March 11, 2004:  Hemoglobin and  hematocrit 14.9 and 44.  Platelets 233,000, blood type and RH A positive,  antibody screen negative, sickle cell trait negative, VDRL nonreactive,  rubella  immune.  Hepatitis B surface antigen negative.  HIV nonreactive.  Pap smear within normal limits.  GC and chlamydia negative.  CF testing  negative.  Declined quad screen at 28 weeks.  One-hour glucose challenge  110, RPR nonreactive at 36 weeks.  Culture of the vaginal tract is positive  for Group B strep, negative for GC and chlamydia.  OB history:  In 2001, the  patient had an elective AB in the first trimester with no complications and  the patient present pregnancy.  She does have a irregular cycles and had an  early ultrasound with this pregnancy at eight weeks six days.   PAST MEDICAL HISTORY:  Significant for morbid obesity.  The patient is  greater than 350 pounds.  She has a history of gastric reflux and had teeth  removed at age 50.   FAMILY HISTORY:  The patient's father has a history of heart disease with MI  and hypertension.  The patient's sister has a history of anemia.  The  patient's sister and aunt have  sarcoidosis and aunt with diabetes.  Paternal  grandmother with kidney problems on dialysis.  Paternal grandfather with  cancer of unknown type.   GENETIC HISTORY:  There is no genetic history of familial or chromosomal  disorders, children that were born with birth defects or that died in  infancy.   ALLERGIES:  The patient has known drug allergies.   SOCIAL HISTORY:  She quit smoking with positive UPT and denies the use of  alcohol or illicit drugs.  Ms. Schaben is a married 40 year old African-  American female.  She is [employer] in her faith.  Aaleyah Witherow is her  husband.  He is involved and supportive.   REVIEW OF SYSTEMS:  As described above.  The patient is at term.  Diagnosed  with preeclampsia.   PHYSICAL EXAMINATION:  Her blood pressures have been 142/79, 140/83, 151/97,  145/92.  She is afebrile.  Pulse 100 and respirations 20.  HEENT is  unremarkable.  Heart:  Regular rate and rhythm.  Lungs clear.  Abdomen is  gravida and contour uterine fundus is  noted to extend, quite elevated due to  obese abdomen.  Ultrasound on August 28, 2004 finds estimated fetal weight  to be 2675 grams, at the 75-90th percentile.  Ultrasound today for BPP shows  her BPP to be 8/8 with an AFI of 18.6.  The baby is cephalic in its  presentation.  Placenta is posterior grade 2 with no previa.  Extremities  show mild edema.  DTRs are 1+ with no clonus.  A 24-hour urine collection  shows 550 mg of protein per day, creatinine is 2224 mg/day, and creatinine  clearance is 193.   ASSESSMENT:  Intrauterine pregnancy at 37-3/7 weeks.  Pre-eclampsia.  Morbid  obesity.  Positive group B strep.   PLAN:  Admit per Naima A. Normand Sloop, M.D.  Start magnesium sulfate 4 gram per  hour bolus and then 2 grams per hour continuously.  Cervidil available for  insertion by Dr. Normand Sloop for induction of labor.  Dr. Normand Sloop will discuss  preeclampsia and options for induction with the patient.  The patient  verbalizes understanding of admission and use of magnesium sulfate for  seizure prophylaxis.      SDM/MEDQ  D:  09/13/2004  T:  09/13/2004  Job:  [id number]

## 2011-01-01 NOTE — Op Note (Signed)
[name], [name]               ACCOUNT NO.:  [id number]   MEDICAL RECORD NO.:  [id number]          PATIENT TYPE:  WOC   LOCATION:  WOC                          FACILITY:  [facility]   PHYSICIAN:  Janine Limbo, M.D.DATE OF BIRTH:  08-Jun-1971   DATE OF PROCEDURE:  09/16/2004  DATE OF DISCHARGE:                                 OPERATIVE REPORT   PREOPERATIVE DIAGNOSES:  1.  Term intrauterine pregnancy.  2.  Preeclampsia.  3.  Obesity (weight approximately 375 pounds).  4.  Failure to progress in labor.   POSTOPERATIVE DIAGNOSES:  1.  Term intrauterine pregnancy.  2.  Preeclampsia.  3.  Obesity (weight approximately 375 pounds).  4.  Failure to progress in labor.  5.  Cephalopelvic disproportion.  6.  Fibroid uterus.   PROCEDURE:  Primary low transverse cesarean section.   SURGEON:  Janine Limbo, M.D.   FIRST ASSISTANT:  1.  Naima A. Normand Sloop, M.D.  2.  Rica Koyanagi, C.N.M.   ANESTHETIC:  Spinal.   DISPOSITION:  [name] is a 40 year old female, gravida 2, para 0-0-1-0,  who presents at 37-1/2 weeks' gestation with preeclampsia. Induction of  labor was begun on September 15, 2004 using Cervidil. The patient's membranes  were ruptured on September 15, 2004.  She dilated her cervix to 2-3 cm. She  was 75% effaced. The fetal head was then a -2 to -3 station, but significant  caput was noted. Options for management were reviewed including cesarean  delivery and continued attempts at Pitocin augmentation of labor. The risk  and benefits of both of those options were reviewed. The patient and her  husband elected to proceed with cesarean delivery. We reviewed the specific  risks of cesarean delivery including, but not limited to, anesthetic  complications, bleeding, infection, and possible damage to the surrounding  organs.   FINDINGS:  A 7 pounds 12 ounces female infant Francesco Sor) was delivered from a  cephalic presentation. The apparatus for 8 at one minute and 9  at five  minutes. A nuchal cord was present. The infant was noted to have significant  caput.  The fallopian tubes and ovaries were normal for the gravid state.  There was a 1 cm fibroid present on the right fundus of the uterus.   PROCEDURE:  The patient was taken to the operating room where a spinal  anesthetic was given. The patient's abdomen was prepped with multiple layers  of Betadine. A Foley catheter had previously been placed. The patient's  abdomen was suspended from a bar at the anesthesia table. The patient was  then sterilely draped. The lower abdomen was injected with 10 cc of  0.5%  Marcaine with epinephrine. A low transverse incision was made and carried  sharply through the subcutaneous tissue, fascia, and the anterior  peritoneum. Surgery was complicated by the patient's markedly obese abdomen.  We were able to develop a temporary bladder flap and incision was made in  the lower uterine segment. The incision was then extended in a low  transverse fashion. The fetal head was delivered without difficulty. The  mouth  and nose were suctioned. The nuchal cord was reduced. The remainder of  the infant was then delivered. Routine cord blood studies were obtained. The  placenta was removed. The uterine cavity was cleaned of amniotic fluid,  clotted blood, and membranes. The uterine incision was then closed using a  running locking suture of 2-0 Vicryl. An imbricating suture of 2-0 Vicryl  was then placed. Hemostasis was adequate. The pelvis was vigorously  irrigated. The anterior peritoneum and abdominal musculature were  reapproximated in the midline using 0 Vicryl. The subcutaneous layer and the  fascia were irrigated. The fascia was closed using two running scissors  sutures of zero Vicryl beginning in the corners and working toward the  midline. The subcutaneous layer was irrigated. A Jackson-Pratt drain was  placed and the subcutaneous layer and brought out through the left  lower  quadrant. The Jackson-Pratt drain was sutured into place using 3-0 silk  suture. The subcutaneous layer was closed using a running suture of 0 plain  catgut.  The skin was reapproximated using a subcuticular suture of 4-0  Vicryl. Sponge, needle, instrument counts were correct on two occasions.  Estimated blood loss was 800 cc. The patient tolerated her procedure well.  The patient was taken to the recovery room in stable condition. The infant  was taken to the full-term nursery in stable condition. The patient was  noted to drain clear yellow urine at the end of her procedure.      AVS/MEDQ  D:  09/16/2004  T:  09/16/2004  Job:  [id number]

## 2012-01-18 ENCOUNTER — Other Ambulatory Visit: Payer: Self-pay | Admitting: Internal Medicine

## 2012-01-18 ENCOUNTER — Other Ambulatory Visit (HOSPITAL_COMMUNITY)
Admission: RE | Admit: 2012-01-18 | Discharge: 2012-01-18 | Disposition: A | Discharge status: Home / Self Care | Payer: BC Managed Care – PPO | Source: Ambulatory Visit | Attending: Internal Medicine | Admitting: Internal Medicine

## 2012-01-18 DIAGNOSIS — Z01419 Encounter for gynecological examination (general) (routine) without abnormal findings: Secondary | ICD-10-CM | POA: Insufficient documentation

## 2012-02-23 ENCOUNTER — Ambulatory Visit: Admit: 2012-02-23 | Payer: Self-pay | Admitting: Obstetrics and Gynecology

## 2014-10-28 ENCOUNTER — Other Ambulatory Visit (HOSPITAL_COMMUNITY)
Admission: RE | Admit: 2014-10-28 | Discharge: 2014-10-28 | Disposition: A | Discharge status: Home / Self Care | Payer: BC Managed Care – PPO | Source: Ambulatory Visit | Attending: Internal Medicine | Admitting: Internal Medicine

## 2014-10-28 ENCOUNTER — Other Ambulatory Visit: Payer: Self-pay | Admitting: Internal Medicine

## 2014-10-28 DIAGNOSIS — Z01419 Encounter for gynecological examination (general) (routine) without abnormal findings: Secondary | ICD-10-CM | POA: Insufficient documentation

## 2014-10-30 LAB — CYTOLOGY - PAP

## 2015-12-31 ENCOUNTER — Encounter: Payer: BC Managed Care – PPO | Attending: Internal Medicine | Admitting: Skilled Nursing Facility1

## 2015-12-31 ENCOUNTER — Encounter: Payer: Self-pay | Admitting: Skilled Nursing Facility1

## 2015-12-31 VITALS — Ht 67.0 in | Wt 357.0 lb

## 2015-12-31 DIAGNOSIS — E669 Obesity, unspecified: Secondary | ICD-10-CM | POA: Diagnosis not present

## 2015-12-31 NOTE — Patient Instructions (Signed)
-  Try to get to the gym 4 days a week for 1 hour  -Proper Bowel Movement: plenty of water, fruit and vegetables, and movement -Honor your body and listen to your hunger and fullness cues -

## 2015-12-31 NOTE — Progress Notes (Signed)
  Medical Nutrition Therapy:  Appt start time: 1600 end time:  1730.   Assessment:  Primary concerns today: obesity. Pt states she needs a specific diet plan that tells her exactly what she needs to eat and why.  Pt states she and her son have stopped eating fried foods and started drinking more water. Pt states she was forced to run by her mother which has made her hate to do certain workouts. Pt shared appointment with her son and her husband was present.  Preferred Learning Style:   No preference indicated   Learning Readiness:   Change in progress   MEDICATIONS: See List   DIETARY INTAKE:  Usual eating pattern includes 3 meals and 3 snacks per day.  Everyday foods include none stated.  Avoided foods include none stated.    24-hr recall:  B ( AM): 2 eggs and fruit Snk ( AM): trail mix----crackers L ( PM): sandwich and chips Snk ( PM): trailmix---chips D ( PM): meat, pasta, vegetable Snk ( PM): ice cream Beverages: water, coffee cream and sugar  Usual physical activity: 3-4 days a week, walking/swimming 45-60 minutes   Estimated energy needs: 1600 calories 180 g carbohydrates 120 g protein 44 g fat  Progress Towards Goal(s):  In progress.   Nutritional Diagnosis:  Colbert-3.3 Overweight/obesity As related to overconsumption.  As evidenced by pt report, 24 hr recall, and BMI 55.91.    Intervention:  Nutrition counseling for obesity. Dietitian educated the pt on balanced meals, meal frequency, and honoring her body. Goals: -Try to get to the gym 4 days a week for 1 hour  -Proper Bowel Movement: plenty of water, fruit and vegetables, and movement -Honor your body and listen to your hunger and fullness cues  Teaching Method Utilized: Visual Auditory Hands on  Handouts given during visit include:  myplate  Low sodium flavoring   Snack sheet  Barriers to learning/adherence to lifestyle change: none identified  Demonstrated degree of understanding via:  Teach  Back   Monitoring/Evaluation:  Dietary intake, exercise, and body weight prn.

## 2020-03-17 ENCOUNTER — Other Ambulatory Visit: Payer: Self-pay

## 2020-03-17 ENCOUNTER — Ambulatory Visit (INDEPENDENT_AMBULATORY_CARE_PROVIDER_SITE_OTHER): Payer: BC Managed Care – PPO | Admitting: Family Medicine

## 2020-03-17 ENCOUNTER — Encounter (INDEPENDENT_AMBULATORY_CARE_PROVIDER_SITE_OTHER): Payer: Self-pay | Admitting: Family Medicine

## 2020-03-17 VITALS — BP 125/84 | HR 86 | Temp 98.2°F | Ht 67.0 in | Wt 375.0 lb

## 2020-03-17 DIAGNOSIS — R5383 Other fatigue: Secondary | ICD-10-CM

## 2020-03-17 DIAGNOSIS — R0602 Shortness of breath: Secondary | ICD-10-CM

## 2020-03-17 DIAGNOSIS — I10 Essential (primary) hypertension: Secondary | ICD-10-CM | POA: Diagnosis not present

## 2020-03-17 DIAGNOSIS — F3289 Other specified depressive episodes: Secondary | ICD-10-CM | POA: Diagnosis not present

## 2020-03-17 DIAGNOSIS — Z9189 Other specified personal risk factors, not elsewhere classified: Secondary | ICD-10-CM | POA: Diagnosis not present

## 2020-03-17 DIAGNOSIS — F418 Other specified anxiety disorders: Secondary | ICD-10-CM

## 2020-03-17 DIAGNOSIS — Z0289 Encounter for other administrative examinations: Secondary | ICD-10-CM

## 2020-03-17 DIAGNOSIS — Z6841 Body Mass Index (BMI) 40.0 and over, adult: Secondary | ICD-10-CM

## 2020-03-17 NOTE — Progress Notes (Signed)
Chief Complaint:   OBESITY [name] (MR# [id number]) is a 49 y.o. female who presents for evaluation and treatment of obesity and related comorbidities. Current BMI is Body mass index is 58.73 kg/m. [name] has been struggling with her weight for many years and has been unsuccessful in either losing weight, maintaining weight loss, or reaching her healthy weight goal.  [name] heard about our clinic from a friend. She lost her husband last year. She is going out to eat 2 times per week. Break is Coffee, 2 tablespoons (lactose intolerant), 1/2 bagel with 1/2 cup of fruit and some protein (2 eggs) (feels satisfied). Around 1030/11 am is snack, fruit cup and crackers or protein bar. Lunch is a sandwich [employer] brewer) or Firehouse Sub (steak and cheese) 6 inch (feels satisfied). Another snack is fruit cup (peaches, pears, or mixed). Dinner is spaghetti or chicken, or salad (1 1/2 cup of spaghetti with Prego and ground beef, 1 cup). Snack before bed is peanut butter crackers.  [name] is currently in the action stage of change and ready to dedicate time achieving and maintaining a healthier weight. [name] is interested in becoming our patient and working on intensive lifestyle modifications including (but not limited to) diet and exercise for weight loss.  [name]'s habits were reviewed today and are as follows: Her family eats meals together, she thinks her family will eat healthier with her, her desired weight loss is 195 lbs, she has been heavy most of her life, she started gaining weight during college, her heaviest weight ever was 370 pounds, she has significant food cravings issues, she snacks frequently in the evenings, she is frequently drinking liquids with calories, she frequently makes poor food choices, she frequently eats larger portions than normal and she struggles with emotional eating.  Depression Screen [name]'s Food and Mood (modified PHQ-9) score was 10.  Depression screen PHQ 2/9  03/17/2020  Decreased Interest 1  Down, Depressed, Hopeless 3  PHQ - 2 Score 4  Altered sleeping 0  Tired, decreased energy 1  Change in appetite 1  Feeling bad or failure about yourself  2  Trouble concentrating 0  Moving slowly or fidgety/restless 2  Suicidal thoughts 0  PHQ-9 Score 10  Difficult doing work/chores Somewhat difficult   Subjective:   1. Other fatigue [name] admits to daytime somnolence and denies waking up still tired. Patent has a history of symptoms of daytime fatigue. [name] generally gets 7 or 8 hours of sleep per night, and states that she has generally restful sleep. Snoring is present. Apneic episodes are present. Epworth Sleepiness Score is 1. EKG-normal sinus rhythm at 78 BPM.  2. SOB (shortness of breath) on exertion [name] notes increasing shortness of breath with exercising and seems to be worsening over time with weight gain. She notes getting out of breath sooner with activity than she used to. This has not gotten worse recently. [name] denies shortness of breath at rest or orthopnea.  3. Essential hypertension [name]'s blood pressure is controlled today. She was diagnosed with hypertension 15 years ago. She is on amlodipine 10 mg.  4. Depression with anxiety Katera denies suicidal ideas or homicidal ideas. Her husband died last year secondary to myocardial infarction. She is not on medications.  5. At risk for heart disease [name] is at a higher than average risk for cardiovascular disease due to obesity.   Assessment/Plan:   1. Other fatigue [name] does feel that her weight is causing her energy to be  lower than it should be. Fatigue may be related to obesity, depression or many other causes. Labs will be ordered, and in the meanwhile, Cheney will focus on self care including making healthy food choices, increasing physical activity and focusing on stress reduction.  - EKG 12-Lead - Comprehensive metabolic panel - Hemoglobin A1c - Insulin,  random - VITAMIN D 25 Hydroxy (Vit-D Deficiency, Fractures) - Vitamin B12 - Folate - T3 - T4  2. SOB (shortness of breath) on exertion Allegra does feel that she gets out of breath more easily that she used to when she exercises. Jung's shortness of breath appears to be obesity related and exercise induced. She has agreed to work on weight loss and gradually increase exercise to treat her exercise induced shortness of breath. Will continue to monitor closely.  3. Essential hypertension [name] is working on healthy weight loss and exercise to improve blood pressure control. We will watch for signs of hypotension as she continues her lifestyle modifications. We will check labs today.  - Comprehensive metabolic panel  4. Depression with anxiety Behavior modification techniques were discussed today to help [name] deal with her emotional/non-hunger eating behaviors. We will continue to monitor. Orders and follow up as documented in patient record.   5. At risk for heart disease [name] was given approximately 15 minutes of coronary artery disease prevention counseling today. She is 49 y.o. female and has risk factors for heart disease including obesity. We discussed intensive lifestyle modifications today with an emphasis on specific weight loss instructions and strategies.   Repetitive spaced learning was employed today to elicit superior memory formation and behavioral change.  6. Class 3 severe obesity with serious comorbidity and body mass index (BMI) of 50.0 to 59.9 in adult, unspecified obesity type (HCC) [name] is currently in the action stage of change and her goal is to continue with weight loss efforts. I recommend [name] begin the structured treatment plan as follows:  She has agreed to the Category 2 Plan + 100 calories with 8 oz of meat at dinner.  Exercise goals: No exercise has been prescribed at this time.   Behavioral modification strategies: increasing lean protein intake,  increasing vegetables, meal planning and cooking strategies, keeping healthy foods in the home and planning for success.  She was informed of the importance of frequent follow-up visits to maximize her success with intensive lifestyle modifications for her multiple health conditions. She was informed we would discuss her lab results at her next visit unless there is a critical issue that needs to be addressed sooner. Tabbetha agreed to keep her next visit at the agreed upon time to discuss these results.  Objective:   Blood pressure 125/84, pulse 86, temperature 98.2 F (36.8 C), temperature source Oral, height 5\' 7"  (1.702 m), weight (!) 375 lb (170.1 kg), last menstrual period 03/09/2020, SpO2 97 %. Body mass index is 58.73 kg/m.  EKG: Normal sinus rhythm, rate 78 BPM.  Indirect Calorimeter completed today shows a VO2 of 257 and a REE of 1788.  Her calculated basal metabolic rate is 0938 thus her basal metabolic rate is worse than expected.  General: Cooperative, alert, well developed, in no acute distress. HEENT: Conjunctivae and lids unremarkable. Cardiovascular: Regular rhythm.  Lungs: Normal work of breathing. Neurologic: No focal deficits.   No results found for: CREATININE, BUN, NA, K, CL, CO2 No results found for: ALT, AST, GGT, ALKPHOS, BILITOT No results found for: HGBA1C No results found for: INSULIN No results found for: TSH  No results found for: CHOL, HDL, LDLCALC, LDLDIRECT, TRIG, CHOLHDL No results found for: WBC, HGB, HCT, MCV, PLT No results found for: IRON, TIBC, FERRITIN  Attestation Statements:   This is the patient's first visit at [facility]. The patient's NEW PATIENT PACKET was reviewed at length. Included in the packet: current and past health history, medications, allergies, ROS, gynecologic history (women only), surgical history, family history, social history, weight history, weight loss surgery history (for those that have had weight loss  surgery), nutritional evaluation, mood and food questionnaire, PHQ9, Epworth questionnaire, sleep habits questionnaire, patient life and health improvement goals questionnaire. These will all be scanned into the patient's chart under media.   During the visit, I independently reviewed the patient's EKG, bioimpedance scale results, and indirect calorimeter results. I used this information to tailor a meal plan for the patient that will help her to lose weight and will improve her obesity-related conditions going forward. I performed a medically necessary appropriate examination and/or evaluation. I discussed the assessment and treatment plan with the patient. The patient was provided an opportunity to ask questions and all were answered. The patient agreed with the plan and demonstrated an understanding of the instructions. Labs were ordered at this visit and will be reviewed at the next visit unless more critical results need to be addressed immediately. Clinical information was updated and documented in the EMR.   Time spent on visit including pre-visit chart review and post-visit care was 45 minutes.   A separate 15 minutes was spent on risk counseling (see above).     I, Trixie Dredge, am acting as transcriptionist for Coralie Common, MD. I have reviewed the above documentation for accuracy and completeness, and I agree with the above. - Jinny Blossom, MD

## 2020-03-18 LAB — INSULIN, RANDOM: INSULIN: 37.9 u[IU]/mL — ABNORMAL HIGH (ref 2.6–24.9)

## 2020-03-18 LAB — COMPREHENSIVE METABOLIC PANEL
ALT: 13 IU/L (ref 0–32)
AST: 19 IU/L (ref 0–40)
Albumin/Globulin Ratio: 1.3 (ref 1.2–2.2)
Albumin: 3.8 g/dL (ref 3.8–4.8)
Alkaline Phosphatase: 54 IU/L (ref 48–121)
BUN/Creatinine Ratio: 14 (ref 9–23)
BUN: 12 mg/dL (ref 6–24)
Bilirubin Total: 0.3 mg/dL (ref 0.0–1.2)
CO2: 21 mmol/L (ref 20–29)
Calcium: 8.9 mg/dL (ref 8.7–10.2)
Chloride: 102 mmol/L (ref 96–106)
Creatinine, Ser: 0.87 mg/dL (ref 0.57–1.00)
GFR calc Af Amer: 92 mL/min/{1.73_m2} (ref >59)
GFR calc non Af Amer: 80 mL/min/{1.73_m2} (ref >59)
Globulin, Total: 2.9 g/dL (ref 1.5–4.5)
Glucose: 107 mg/dL — ABNORMAL HIGH (ref 65–99)
Potassium: 4.2 mmol/L (ref 3.5–5.2)
Sodium: 138 mmol/L (ref 134–144)
Total Protein: 6.7 g/dL (ref 6.0–8.5)

## 2020-03-18 LAB — T3: T3, Total: 179 ng/dL (ref 71–180)

## 2020-03-18 LAB — FOLATE: Folate: 7.8 ng/mL (ref >3.0)

## 2020-03-18 LAB — HEMOGLOBIN A1C
Est. average glucose Bld gHb Est-mCnc: 117 mg/dL
Hgb A1c MFr Bld: 5.7 % — ABNORMAL HIGH (ref 4.8–5.6)

## 2020-03-18 LAB — T4: T4, Total: 9.2 ug/dL (ref 4.5–12.0)

## 2020-03-18 LAB — VITAMIN D 25 HYDROXY (VIT D DEFICIENCY, FRACTURES): Vit D, 25-Hydroxy: 11.8 ng/mL — ABNORMAL LOW (ref 30.0–100.0)

## 2020-03-18 LAB — VITAMIN B12: Vitamin B-12: 301 pg/mL (ref 232–1245)

## 2020-03-24 ENCOUNTER — Ambulatory Visit (INDEPENDENT_AMBULATORY_CARE_PROVIDER_SITE_OTHER): Payer: Self-pay | Admitting: Family Medicine

## 2020-04-01 ENCOUNTER — Other Ambulatory Visit: Payer: Self-pay

## 2020-04-01 ENCOUNTER — Encounter (INDEPENDENT_AMBULATORY_CARE_PROVIDER_SITE_OTHER): Payer: Self-pay | Admitting: Family Medicine

## 2020-04-01 ENCOUNTER — Ambulatory Visit (INDEPENDENT_AMBULATORY_CARE_PROVIDER_SITE_OTHER): Payer: BC Managed Care – PPO | Admitting: Family Medicine

## 2020-04-01 VITALS — BP 128/82 | HR 77 | Temp 98.2°F | Ht 67.0 in | Wt 371.0 lb

## 2020-04-01 DIAGNOSIS — R7303 Prediabetes: Secondary | ICD-10-CM | POA: Diagnosis not present

## 2020-04-01 DIAGNOSIS — E559 Vitamin D deficiency, unspecified: Secondary | ICD-10-CM | POA: Diagnosis not present

## 2020-04-01 DIAGNOSIS — Z9189 Other specified personal risk factors, not elsewhere classified: Secondary | ICD-10-CM

## 2020-04-01 DIAGNOSIS — F418 Other specified anxiety disorders: Secondary | ICD-10-CM

## 2020-04-01 DIAGNOSIS — Z6841 Body Mass Index (BMI) 40.0 and over, adult: Secondary | ICD-10-CM

## 2020-04-01 MED ORDER — SERTRALINE HCL 25 MG PO TABS: 50.0000 mg | ORAL_TABLET | Freq: Every day | ORAL | 0 refills | Status: DC

## 2020-04-01 MED ORDER — VITAMIN D (ERGOCALCIFEROL) 1.25 MG (50000 UNIT) PO CAPS: 50000.0000 [IU] | ORAL_CAPSULE | ORAL | 0 refills | Status: DC

## 2020-04-03 NOTE — Progress Notes (Signed)
Chief Complaint:   OBESITY [name] is here to discuss her progress with her obesity treatment plan along with follow-up of her obesity related diagnoses. [name] is on the Category 2 Plan + 100 calories and states she is following her eating plan approximately 80% of the time. [name] states she is walking 1 mile for 20-30 minutes 3 times per week.  Today's visit was #: 2 Starting weight: 375 lbs Starting date: 03/17/2020 Today's weight: 371 lbs Today's date: 04/01/2020 Total lbs lost to date: 4 Total lbs lost since last in-office visit: 4  Interim History: [name] had to cut back from dairy as it was starting to bother her belly. She is still hungry at night but trying to drink water to see if it's true hunger. She is using L[employer] It app to track food. She is eating between 1300-1388 calories.  Subjective:   1. Vitamin D deficiency [name] has a new diagnosis of Vit D deficiency. She is not on Vit D supplementation, and she notes fatigue. I discussed labs with the patient today.  2. Pre-diabetes [name]'s last A1c was 5.7 and insulin 37.9. She is not really experiencing many cravings. I discussed labs with the patient today.  3. Depression with anxiety [name] has a new diagnosis of depression with anxiety. She denies suicidal ideas or homicidal ideas. She voices the recent passing of her husband, and her aunt is currently in [place] with [place].   4. At risk for osteoporosis [name] is at higher risk of osteopenia and osteoporosis due to Vitamin D deficiency.   Assessment/Plan:   1. Vitamin D deficiency Low Vitamin D level contributes to fatigue and are associated with obesity, breast, and colon cancer. Amerie agreed to start prescription Vitamin D 50,000 IU every week with no refills. She will follow-up for routine testing of Vitamin D, at least 2-3 times per year to avoid over-replacement.  - Vitamin D, Ergocalciferol, (DRISDOL) 1.25 MG (50000 UNIT) CAPS capsule; Take 1 capsule (50,000  Units total) by mouth every 7 (seven) days.  Dispense: 4 capsule; Refill: 0  2. Pre-diabetes [name] will continue to work on weight loss, exercise, and decreasing simple carbohydrates to help decrease the risk of diabetes. We will repeat labs in 3 months.  3. Depression with anxiety Behavior modification techniques were discussed today to help [name] deal with her emotional/non-hunger eating behaviors. [name] agreed to start Zoloft 25 mg PO daily with no refills. Orders and follow up as documented in patient record.   - sertraline (ZOLOFT) 25 MG tablet; Take 2 tablets (50 mg total) by mouth daily.  Dispense: 30 tablet; Refill: 0  4. At risk for osteoporosis [name] was given approximately 30 minutes of osteoporosis prevention counseling today. [name] is at risk for osteopenia and osteoporosis due to her Vitamin D deficiency. She was encouraged to take her Vitamin D and follow her higher calcium diet and increase strengthening exercise to help strengthen her bones and decrease her risk of osteopenia and osteoporosis.  Repetitive spaced learning was employed today to elicit superior memory formation and behavioral change.  5. Class 3 severe obesity with serious comorbidity and body mass index (BMI) of 50.0 to 59.9 in adult, unspecified obesity type (HCC) [name] is currently in the action stage of change. As such, her goal is to continue with weight loss efforts. She has agreed to the Category 2 Plan and keeping a food journal and adhering to recommended goals of 400-500 calories and 35+ grams of protein at supper daily.  Exercise goals: As is.  Behavioral modification strategies: increasing lean protein intake, meal planning and cooking strategies, keeping healthy foods in the home, emotional eating strategies and planning for success.  [name] has agreed to follow-up with our clinic in 2 weeks. She was informed of the importance of frequent follow-up visits to maximize her success with intensive  lifestyle modifications for her multiple health conditions.   Objective:   Blood pressure 128/82, pulse 77, temperature 98.2 F (36.8 C), temperature source Oral, height 5\' 7"  (1.702 m), weight (!) 371 lb (168.3 kg), last menstrual period 03/09/2020, SpO2 98 %. Body mass index is 58.11 kg/m.  General: Cooperative, alert, well developed, in no acute distress. HEENT: Conjunctivae and lids unremarkable. Cardiovascular: Regular rhythm.  Lungs: Normal work of breathing. Neurologic: No focal deficits.   Lab Results  Component Value Date   CREATININE 0.87 03/17/2020   BUN 12 03/17/2020   NA 138 03/17/2020   K 4.2 03/17/2020   CL 102 03/17/2020   CO2 21 03/17/2020   Lab Results  Component Value Date   ALT 13 03/17/2020   AST 19 03/17/2020   ALKPHOS 54 03/17/2020   BILITOT 0.3 03/17/2020   Lab Results  Component Value Date   HGBA1C 5.7 (H) 03/17/2020   Lab Results  Component Value Date   INSULIN 37.9 (H) 03/17/2020   No results found for: TSH No results found for: CHOL, HDL, LDLCALC, LDLDIRECT, TRIG, CHOLHDL No results found for: WBC, HGB, HCT, MCV, PLT No results found for: IRON, TIBC, FERRITIN  Attestation Statements:   Reviewed by clinician on day of visit: allergies, medications, problem list, medical history, surgical history, family history, social history, and previous encounter notes.   I, Trixie Dredge, am acting as transcriptionist for Coralie Common, MD.  I have reviewed the above documentation for accuracy and completeness, and I agree with the above. - Jinny Blossom, MD

## 2020-04-06 ENCOUNTER — Encounter (INDEPENDENT_AMBULATORY_CARE_PROVIDER_SITE_OTHER): Payer: Self-pay | Admitting: Family Medicine

## 2020-04-07 NOTE — Telephone Encounter (Signed)
Please advise 

## 2020-04-11 ENCOUNTER — Encounter (INDEPENDENT_AMBULATORY_CARE_PROVIDER_SITE_OTHER): Payer: Self-pay | Admitting: Family Medicine

## 2020-04-14 NOTE — Telephone Encounter (Signed)
Please advise 

## 2020-04-17 ENCOUNTER — Encounter (INDEPENDENT_AMBULATORY_CARE_PROVIDER_SITE_OTHER): Payer: Self-pay | Admitting: Family Medicine

## 2020-04-17 ENCOUNTER — Other Ambulatory Visit: Payer: Self-pay

## 2020-04-17 ENCOUNTER — Ambulatory Visit (INDEPENDENT_AMBULATORY_CARE_PROVIDER_SITE_OTHER): Payer: BC Managed Care – PPO | Admitting: Family Medicine

## 2020-04-17 VITALS — BP 159/95 | HR 74 | Temp 98.0°F | Ht 67.0 in | Wt 361.0 lb

## 2020-04-17 DIAGNOSIS — E559 Vitamin D deficiency, unspecified: Secondary | ICD-10-CM | POA: Diagnosis not present

## 2020-04-17 DIAGNOSIS — Z9189 Other specified personal risk factors, not elsewhere classified: Secondary | ICD-10-CM

## 2020-04-17 DIAGNOSIS — F418 Other specified anxiety disorders: Secondary | ICD-10-CM

## 2020-04-17 DIAGNOSIS — I10 Essential (primary) hypertension: Secondary | ICD-10-CM | POA: Diagnosis not present

## 2020-04-17 DIAGNOSIS — Z6841 Body Mass Index (BMI) 40.0 and over, adult: Secondary | ICD-10-CM

## 2020-04-17 MED ORDER — VITAMIN D (ERGOCALCIFEROL) 1.25 MG (50000 UNIT) PO CAPS: 50000.0000 [IU] | ORAL_CAPSULE | ORAL | 0 refills | Status: DC

## 2020-04-22 NOTE — Progress Notes (Signed)
Chief Complaint:   OBESITY [name] is here to discuss her progress with her obesity treatment plan along with follow-up of her obesity related diagnoses. [name] is on the Category 2 Plan and keeping a food journal and adhering to recommended goals of 400-500 calories and 35+ grams of protein at supper daily and states she is following her eating plan approximately 90% of the time. [name] states she is walking 6,500-7,000 steps 7 times per week.  Today's visit was #: 3 Starting weight: 375 lbs Starting date: 03/17/2020 Today's weight: 361 lbs Today's date: 04/17/2020 Total lbs lost to date: 14 Total lbs lost since last in-office visit: 10  Interim History: [name] really used to take out list as options for when she didn't have snacks. She is tracking her food daily. She notes hunger in the afternoon and in the evening especially if she is late getting her  Snacks. She does find the meals to be filling.  Subjective:   1. Essential hypertension [name]'s blood pressure is slightly elevated today. She denies chest pain, chest pressure, or headache. She is on amlodipine.  2. Vitamin D deficiency [name] is on prescription Vit D. Last vit D level was 11.8. She denies nausea, vomiting, or muscle weakness but she notes fatigue.  3. Depression with anxiety [name] denies suicidal ideas or homicidal ideas. She fell ill on 2 on pills of sertraline so she decreased down to 1 pil. She feels her symptoms are better controlled of symptoms with 1 tablet.  4. At risk for heart disease [name] is at a higher than average risk for cardiovascular disease due to obesity, hypertension, pre-diabetes, and elevated blood pressure.   Assessment/Plan:   1. Essential hypertension [name] is working on healthy weight loss and exercise to improve blood pressure control. We will watch for signs of hypotension as she continues her lifestyle modifications. We will follow up on her blood pressure at her next  appointment. No change in medication dosage today but, if her blood pressure is elevated at her next appointment then we will discuss medication change.  2. Vitamin D deficiency Low Vitamin D level contributes to fatigue and are associated with obesity, breast, and colon cancer. We will refill prescription Vitamin D for 1 month. [name] will follow-up for routine testing of Vitamin D, at least 2-3 times per year to avoid over-replacement.  - Vitamin D, Ergocalciferol, (DRISDOL) 1.25 MG (50000 UNIT) CAPS capsule; Take 1 capsule (50,000 Units total) by mouth every 7 (seven) days.  Dispense: 4 capsule; Refill: 0  3. Depression with anxiety Behavior modification techniques were discussed today to help [name] deal with her emotional/non-hunger eating behaviors. [name] will continue 1 tablet of sertraline daily, no refill needed. Orders and follow up as documented in patient record.   4. At risk for heart disease [name] was given approximately 15 minutes of coronary artery disease prevention counseling today. She is 49 y.o. female and has risk factors for heart disease including obesity. We discussed intensive lifestyle modifications today with an emphasis on specific weight loss instructions and strategies.   Repetitive spaced learning was employed today to elicit superior memory formation and behavioral change.  5. Class 3 severe obesity with serious comorbidity and body mass index (BMI) of 50.0 to 59.9 in adult, unspecified obesity type (HCC) [name] is currently in the action stage of change. As such, her goal is to continue with weight loss efforts. She has agreed to the Category 3 Plan with 8 oz of meat at  dinner.   Exercise goals: All adults should avoid inactivity. Some physical activity is better than none, and adults who participate in any amount of physical activity gain some health benefits.  Behavioral modification strategies: increasing lean protein intake, increasing vegetables, meal  planning and cooking strategies and planning for success.  [name] has agreed to follow-up with our clinic in 2 weeks. She was informed of the importance of frequent follow-up visits to maximize her success with intensive lifestyle modifications for her multiple health conditions.   Objective:   Blood pressure (!) 159/95, pulse 74, temperature 98 F (36.7 C), temperature source Oral, height 5\' 7"  (1.702 m), weight (!) 361 lb (163.7 kg), last menstrual period 04/01/2020, SpO2 98 %. Body mass index is 56.54 kg/m.  General: Cooperative, alert, well developed, in no acute distress. HEENT: Conjunctivae and lids unremarkable. Cardiovascular: Regular rhythm.  Lungs: Normal work of breathing. Neurologic: No focal deficits.   Lab Results  Component Value Date   CREATININE 0.87 03/17/2020   BUN 12 03/17/2020   NA 138 03/17/2020   K 4.2 03/17/2020   CL 102 03/17/2020   CO2 21 03/17/2020   Lab Results  Component Value Date   ALT 13 03/17/2020   AST 19 03/17/2020   ALKPHOS 54 03/17/2020   BILITOT 0.3 03/17/2020   Lab Results  Component Value Date   HGBA1C 5.7 (H) 03/17/2020   Lab Results  Component Value Date   INSULIN 37.9 (H) 03/17/2020   No results found for: TSH No results found for: CHOL, HDL, LDLCALC, LDLDIRECT, TRIG, CHOLHDL No results found for: WBC, HGB, HCT, MCV, PLT No results found for: IRON, TIBC, FERRITIN  Attestation Statements:   Reviewed by clinician on day of visit: allergies, medications, problem list, medical history, surgical history, family history, social history, and previous encounter notes.   I, Trixie Dredge, am acting as transcriptionist for Coralie Common, MD.  I have reviewed the above documentation for accuracy and completeness, and I agree with the above. - Jinny Blossom, MD

## 2020-04-28 ENCOUNTER — Encounter (INDEPENDENT_AMBULATORY_CARE_PROVIDER_SITE_OTHER): Payer: Self-pay | Admitting: Bariatrics

## 2020-04-29 ENCOUNTER — Ambulatory Visit (INDEPENDENT_AMBULATORY_CARE_PROVIDER_SITE_OTHER): Payer: BC Managed Care – PPO | Admitting: Bariatrics

## 2020-04-29 ENCOUNTER — Other Ambulatory Visit: Payer: Self-pay

## 2020-04-29 ENCOUNTER — Encounter (INDEPENDENT_AMBULATORY_CARE_PROVIDER_SITE_OTHER): Payer: Self-pay | Admitting: Bariatrics

## 2020-04-29 VITALS — BP 145/84 | HR 72 | Temp 98.2°F | Ht 67.0 in | Wt 259.0 lb

## 2020-04-29 DIAGNOSIS — F418 Other specified anxiety disorders: Secondary | ICD-10-CM

## 2020-04-29 DIAGNOSIS — Z6841 Body Mass Index (BMI) 40.0 and over, adult: Secondary | ICD-10-CM

## 2020-04-29 DIAGNOSIS — Z9189 Other specified personal risk factors, not elsewhere classified: Secondary | ICD-10-CM

## 2020-04-29 DIAGNOSIS — I10 Essential (primary) hypertension: Secondary | ICD-10-CM

## 2020-04-29 MED ORDER — SERTRALINE HCL 25 MG PO TABS: 25.0000 mg | ORAL_TABLET | Freq: Every day | ORAL | 0 refills | Status: DC

## 2020-04-30 ENCOUNTER — Ambulatory Visit (INDEPENDENT_AMBULATORY_CARE_PROVIDER_SITE_OTHER): Payer: BC Managed Care – PPO | Admitting: Family Medicine

## 2020-05-05 ENCOUNTER — Encounter (INDEPENDENT_AMBULATORY_CARE_PROVIDER_SITE_OTHER): Payer: Self-pay | Admitting: Bariatrics

## 2020-05-05 DIAGNOSIS — I10 Essential (primary) hypertension: Secondary | ICD-10-CM | POA: Insufficient documentation

## 2020-05-05 NOTE — Progress Notes (Signed)
Chief Complaint:   OBESITY [name] is here to discuss her progress with her obesity treatment plan along with follow-up of her obesity related diagnoses. [name] is on the Category 2 Plan +100 calories and states she is following her eating plan approximately 90% of the time. [name] states she is walking/cycling for 20 minutes 3 times per week.  Today's visit was #: 4 Starting weight: 375 lbs Starting date: 03/17/2020 Today's weight: 359 lbs Today's date: 04/29/2020 Total lbs lost to date: 16 lbs Total lbs lost since last in-office visit: 2 lbs  Interim History: [name] is down 2 pounds.  She was switched to the Category 2 plan.  Subjective:   1. Essential hypertension Review: taking medications as instructed, no medication side effects noted, no chest pain on exertion, no dyspnea on exertion, no swelling of ankles.  Blood pressure is reasonably well-controlled.  BP Readings from Last 3 Encounters:  04/29/20 (!) 145/84  04/17/20 (!) 159/95  04/01/20 128/82   2. Depression with anxiety She denies suicidal or homicidal ideation.  3. At risk for osteoporosis [name] is at higher risk of osteopenia and osteoporosis due to Vitamin D deficiency.   Assessment/Plan:   1. Essential hypertension [name] is working on healthy weight loss and exercise to improve blood pressure control. We will watch for signs of hypotension as she continues her lifestyle modifications.  Continue medication.  2. Depression with anxiety Continue medication.  -Refill sertraline (ZOLOFT) 25 MG tablet; Take 1 tablet (25 mg total) by mouth daily.  Dispense: 30 tablet; Refill: 0  3. At risk for osteoporosis [name] was given approximately 15 minutes of osteoporosis prevention counseling today. [name] is at risk for osteopenia and osteoporosis due to her Vitamin D deficiency. She was encouraged to take her Vitamin D and follow her higher calcium diet and increase strengthening exercise to help strengthen her  bones and decrease her risk of osteopenia and osteoporosis.  Repetitive spaced learning was employed today to elicit superior memory formation and behavioral change.  4. Class 3 severe obesity with serious comorbidity and body mass index (BMI) of 50.0 to 59.9 in adult, unspecified obesity type (HCC) [name] is currently in the action stage of change. As such, her goal is to continue with weight loss efforts. She has agreed to the Category 2 Plan +100 calories.   [name] will work on meal planning, intentional eating, and will switch back to the Category 2 plan +100 calories due to lack of weight loss.  Exercise goals: Walking/cycling for 20 minutes 3 times per week.  Behavioral modification strategies: increasing lean protein intake, decreasing simple carbohydrates, increasing vegetables, increasing water intake, decreasing eating out, no skipping meals, meal planning and cooking strategies, keeping healthy foods in the home and planning for success.  [name] has agreed to follow-up with our clinic in 2 weeks with Dr. Jearld Shines. She was informed of the importance of frequent follow-up visits to maximize her success with intensive lifestyle modifications for her multiple health conditions.   Objective:   Blood pressure (!) 145/84, pulse 72, temperature 98.2 F (36.8 C), height 5\' 7"  (1.702 m), weight 259 lb (117.5 kg), last menstrual period 04/01/2020, SpO2 98 %. Body mass index is 40.57 kg/m.  General: Cooperative, alert, well developed, in no acute distress. HEENT: Conjunctivae and lids unremarkable. Cardiovascular: Regular rhythm.  Lungs: Normal work of breathing. Neurologic: No focal deficits.   Lab Results  Component Value Date   CREATININE 0.87 03/17/2020   BUN 12 03/17/2020  NA 138 03/17/2020   K 4.2 03/17/2020   CL 102 03/17/2020   CO2 21 03/17/2020   Lab Results  Component Value Date   ALT 13 03/17/2020   AST 19 03/17/2020   ALKPHOS 54 03/17/2020   BILITOT 0.3 03/17/2020     Lab Results  Component Value Date   HGBA1C 5.7 (H) 03/17/2020   Lab Results  Component Value Date   INSULIN 37.9 (H) 03/17/2020   Attestation Statements:   Reviewed by clinician on day of visit: allergies, medications, problem list, medical history, surgical history, family history, social history, and previous encounter notes.  I, Water quality scientist, CMA, am acting as Location manager for [employer], DO  I have reviewed the above documentation for accuracy and completeness, and I agree with the above. Jearld Lesch, DO

## 2020-05-13 ENCOUNTER — Encounter (INDEPENDENT_AMBULATORY_CARE_PROVIDER_SITE_OTHER): Payer: Self-pay | Admitting: Family Medicine

## 2020-05-13 ENCOUNTER — Other Ambulatory Visit: Payer: Self-pay

## 2020-05-13 ENCOUNTER — Ambulatory Visit (INDEPENDENT_AMBULATORY_CARE_PROVIDER_SITE_OTHER): Payer: BC Managed Care – PPO | Admitting: Family Medicine

## 2020-05-13 VITALS — BP 117/79 | HR 72 | Temp 98.2°F | Ht 67.0 in | Wt 358.0 lb

## 2020-05-13 DIAGNOSIS — R7303 Prediabetes: Secondary | ICD-10-CM | POA: Diagnosis not present

## 2020-05-13 DIAGNOSIS — Z6841 Body Mass Index (BMI) 40.0 and over, adult: Secondary | ICD-10-CM | POA: Diagnosis not present

## 2020-05-13 DIAGNOSIS — E559 Vitamin D deficiency, unspecified: Secondary | ICD-10-CM | POA: Diagnosis not present

## 2020-05-14 NOTE — Progress Notes (Signed)
Chief Complaint:   OBESITY [name] is here to discuss her progress with her obesity treatment plan along with follow-up of her obesity related diagnoses. [name] is on the Category 2 Plan + 100 calories and states she is following her eating plan approximately 85% of the time. [name] states she is at the gym and walking and cycling for 30 minutes 3 times per week.  Today's visit was #: 5 Starting weight: 375 lbs Starting date: 03/17/2020 Today's weight: 358 lbs Today's date: 05/13/2020 Total lbs lost to date: 17 Total lbs lost since last in-office visit: 1  Interim History: [name] has prepared meals for the whole week to try to decrease the likelihood of stopping for food en route home. She is noticing she is losing less since starting the gym. She has been emotionally eating as she and her husband's anniversary and anniversary of his birthday (he died last year).  Subjective:   1. Pre-diabetes [name]'s last A1c was 5.7 and insulin 37.9. She is not on medications.  2. Vitamin D deficiency [name] denies nausea, vomiting, or muscle weakness, but she notes fatigue. She is on prescription Vit D.  Assessment/Plan:   1. Pre-diabetes [name] will continue her Category 3 plan, and will continue to work on weight loss, exercise, and decreasing simple carbohydrates to help decrease the risk of diabetes. We will repeat labs in 2 months.  2. Vitamin D deficiency Low Vitamin D level contributes to fatigue and are associated with obesity, breast, and colon cancer. [name] agreed to continue taking prescription Vitamin D 50,000 IU every week, no refill needed. She will follow-up for routine testing of Vitamin D, at least 2-3 times per year to avoid over-replacement.  3. Class 3 severe obesity with serious comorbidity and body mass index (BMI) of 50.0 to 59.9 in adult, unspecified obesity type (HCC) [name] is currently in the action stage of change. As such, her goal is to continue with weight loss  efforts. She has agreed to the Category 3 Plan.   Exercise goals: All adults should avoid inactivity. Some physical activity is better than none, and adults who participate in any amount of physical activity gain some health benefits.  Behavioral modification strategies: increasing lean protein intake, increasing vegetables, meal planning and cooking strategies, keeping healthy foods in the home and planning for success.  [name] has agreed to follow-up with our clinic in 2 weeks. She was informed of the importance of frequent follow-up visits to maximize her success with intensive lifestyle modifications for her multiple health conditions.   Objective:   Blood pressure 117/79, pulse 72, temperature 98.2 F (36.8 C), temperature source Oral, height 5\' 7"  (1.702 m), weight 258 lb (117 kg), last menstrual period 04/21/2020, SpO2 96 %. Body mass index is 40.41 kg/m.  General: Cooperative, alert, well developed, in no acute distress. HEENT: Conjunctivae and lids unremarkable. Cardiovascular: Regular rhythm.  Lungs: Normal work of breathing. Neurologic: No focal deficits.   Lab Results  Component Value Date   CREATININE 0.87 03/17/2020   BUN 12 03/17/2020   NA 138 03/17/2020   K 4.2 03/17/2020   CL 102 03/17/2020   CO2 21 03/17/2020   Lab Results  Component Value Date   ALT 13 03/17/2020   AST 19 03/17/2020   ALKPHOS 54 03/17/2020   BILITOT 0.3 03/17/2020   Lab Results  Component Value Date   HGBA1C 5.7 (H) 03/17/2020   Lab Results  Component Value Date   INSULIN 37.9 (H) 03/17/2020  No results found for: TSH No results found for: CHOL, HDL, LDLCALC, LDLDIRECT, TRIG, CHOLHDL No results found for: WBC, HGB, HCT, MCV, PLT No results found for: IRON, TIBC, FERRITIN  Attestation Statements:   Reviewed by clinician on day of visit: allergies, medications, problem list, medical history, surgical history, family history, social history, and previous encounter notes.  Time  spent on visit including pre-visit chart review and post-visit care and charting was 15 minutes.    I, Trixie Dredge, am acting as transcriptionist for Coralie Common, MD. I have reviewed the above documentation for accuracy and completeness, and I agree with the above. - Jinny Blossom, MD

## 2020-05-27 ENCOUNTER — Other Ambulatory Visit (INDEPENDENT_AMBULATORY_CARE_PROVIDER_SITE_OTHER): Payer: Self-pay | Admitting: Family Medicine

## 2020-05-27 ENCOUNTER — Other Ambulatory Visit (INDEPENDENT_AMBULATORY_CARE_PROVIDER_SITE_OTHER): Payer: Self-pay | Admitting: Bariatrics

## 2020-05-27 DIAGNOSIS — E559 Vitamin D deficiency, unspecified: Secondary | ICD-10-CM

## 2020-05-27 DIAGNOSIS — F418 Other specified anxiety disorders: Secondary | ICD-10-CM

## 2020-05-29 ENCOUNTER — Encounter (INDEPENDENT_AMBULATORY_CARE_PROVIDER_SITE_OTHER): Payer: Self-pay | Admitting: Family Medicine

## 2020-05-29 ENCOUNTER — Ambulatory Visit (INDEPENDENT_AMBULATORY_CARE_PROVIDER_SITE_OTHER): Payer: BC Managed Care – PPO | Admitting: Family Medicine

## 2020-05-29 ENCOUNTER — Other Ambulatory Visit: Payer: Self-pay

## 2020-05-29 VITALS — BP 123/82 | HR 71 | Temp 98.5°F | Ht 67.0 in | Wt 357.0 lb

## 2020-05-29 DIAGNOSIS — F418 Other specified anxiety disorders: Secondary | ICD-10-CM | POA: Diagnosis not present

## 2020-05-29 DIAGNOSIS — Z9189 Other specified personal risk factors, not elsewhere classified: Secondary | ICD-10-CM | POA: Diagnosis not present

## 2020-05-29 DIAGNOSIS — E559 Vitamin D deficiency, unspecified: Secondary | ICD-10-CM | POA: Diagnosis not present

## 2020-05-29 DIAGNOSIS — Z6841 Body Mass Index (BMI) 40.0 and over, adult: Secondary | ICD-10-CM

## 2020-05-29 MED ORDER — SERTRALINE HCL 25 MG PO TABS: 25.0000 mg | ORAL_TABLET | Freq: Every day | ORAL | 0 refills | Status: DC

## 2020-05-29 MED ORDER — VITAMIN D (ERGOCALCIFEROL) 1.25 MG (50000 UNIT) PO CAPS: 50000.0000 [IU] | ORAL_CAPSULE | ORAL | 0 refills | Status: DC

## 2020-06-03 NOTE — Progress Notes (Signed)
Chief Complaint:   OBESITY [name] is here to discuss her progress with her obesity treatment plan along with follow-up of her obesity related diagnoses. [name] is on the Category 2 Plan and states she is following her eating plan approximately 90% of the time. [name] states she is doing cardio for 30 minutes 3 times per week and lifting weights for 30 minutes 3 times per week.  Today's visit was #: 6 Starting weight: 375 lbs Starting date: 03/17/2020 Today's weight: 357 lbs Today's date: 05/29/2020 Total lbs lost to date: 18 lbs Total lbs lost since last in-office visit: 1 lb  Interim History: [name] voices that the last 2 weeks have been okay in terms of getting all food in.  She says she is not starving before a meal.  She does not feel hungry.  She is doing [employer] for a snack as well as Scientist, physiological.  Getting in a full 8 ounces of protein at dinner.  Subjective:   1. Vitamin D deficiency [name]'s Vitamin D level was 11.8 on 03/17/2020. She is currently taking prescription vitamin D 50,000 IU each week. She denies nausea, vomiting or muscle weakness.  She endorses fatigue.  2. Depression with anxiety Denies SI/HI.  Symptoms are better controlled on medication.  3. At risk for impaired metabolic function [name] is at increased risk for impaired metabolic function due to decreased oral food intake.  Assessment/Plan:   1. Vitamin D deficiency Low Vitamin D level contributes to fatigue and are associated with obesity, breast, and colon cancer. She agrees to continue to take prescription Vitamin D @50 ,000 IU every week and will follow-up for routine testing of Vitamin D, at least 2-3 times per year to avoid over-replacement.  -Refill Vitamin D, Ergocalciferol, (DRISDOL) 1.25 MG (50000 UNIT) CAPS capsule; Take 1 capsule (50,000 Units total) by mouth every 7 (seven) days.  Dispense: 4 capsule; Refill: 0  2. Depression with anxiety Refill sertraline, as per  below.  -Refill sertraline (ZOLOFT) 25 MG tablet; Take 1 tablet (25 mg total) by mouth daily.  Dispense: 30 tablet; Refill: 0  3. At risk for impaired metabolic function [name] was given approximately 15 minutes of impaired  metabolic function prevention counseling today. We discussed intensive lifestyle modifications today with an emphasis on specific nutrition and exercise instructions and strategies.   Repetitive spaced learning was employed today to elicit superior memory formation and behavioral change.  4. Class 3 severe obesity with serious comorbidity and body mass index (BMI) of 50.0 to 59.9 in adult, unspecified obesity type (HCC)  [name] is currently in the action stage of change. As such, her goal is to continue with weight loss efforts. She has agreed to the Category 3 Plan.   Exercise goals: As is.  Behavioral modification strategies: increasing lean protein intake, meal planning and cooking strategies, keeping healthy foods in the home and planning for success.  [name] has agreed to follow-up with our clinic in 2-3 weeks. She was informed of the importance of frequent follow-up visits to maximize her success with intensive lifestyle modifications for her multiple health conditions.   Objective:   Blood pressure 123/82, pulse 71, temperature 98.5 F (36.9 C), temperature source Oral, height 5\' 7"  (1.702 m), weight (!) 357 lb (161.9 kg), last menstrual period 05/25/2020, SpO2 99 %. Body mass index is 55.91 kg/m.  General: Cooperative, alert, well developed, in no acute distress. HEENT: Conjunctivae and lids unremarkable. Cardiovascular: Regular rhythm.  Lungs: Normal work  of breathing. Neurologic: No focal deficits.   Lab Results  Component Value Date   CREATININE 0.87 03/17/2020   BUN 12 03/17/2020   NA 138 03/17/2020   K 4.2 03/17/2020   CL 102 03/17/2020   CO2 21 03/17/2020   Lab Results  Component Value Date   ALT 13 03/17/2020   AST 19 03/17/2020    ALKPHOS 54 03/17/2020   BILITOT 0.3 03/17/2020   Lab Results  Component Value Date   HGBA1C 5.7 (H) 03/17/2020   Lab Results  Component Value Date   INSULIN 37.9 (H) 03/17/2020   Attestation Statements:   Reviewed by clinician on day of visit: allergies, medications, problem list, medical history, surgical history, family history, social history, and previous encounter notes.  I, Water quality scientist, CMA, am acting as transcriptionist for Coralie Common, MD.  I have reviewed the above documentation for accuracy and completeness, and I agree with the above. - Jinny Blossom, MD

## 2020-06-26 ENCOUNTER — Ambulatory Visit (INDEPENDENT_AMBULATORY_CARE_PROVIDER_SITE_OTHER): Payer: BC Managed Care – PPO | Admitting: Family Medicine

## 2020-06-26 ENCOUNTER — Other Ambulatory Visit: Payer: Self-pay

## 2020-06-26 ENCOUNTER — Encounter (INDEPENDENT_AMBULATORY_CARE_PROVIDER_SITE_OTHER): Payer: Self-pay | Admitting: Family Medicine

## 2020-06-26 VITALS — BP 148/85 | HR 76 | Temp 98.1°F | Ht 67.0 in | Wt 354.0 lb

## 2020-06-26 DIAGNOSIS — F418 Other specified anxiety disorders: Secondary | ICD-10-CM

## 2020-06-26 DIAGNOSIS — E559 Vitamin D deficiency, unspecified: Secondary | ICD-10-CM | POA: Diagnosis not present

## 2020-06-26 DIAGNOSIS — Z9189 Other specified personal risk factors, not elsewhere classified: Secondary | ICD-10-CM | POA: Diagnosis not present

## 2020-06-26 DIAGNOSIS — Z6841 Body Mass Index (BMI) 40.0 and over, adult: Secondary | ICD-10-CM | POA: Diagnosis not present

## 2020-06-26 MED ORDER — VITAMIN D (ERGOCALCIFEROL) 1.25 MG (50000 UNIT) PO CAPS: 50000.0000 [IU] | ORAL_CAPSULE | ORAL | 0 refills | Status: DC

## 2020-06-26 MED ORDER — BUSPIRONE HCL 7.5 MG PO TABS: 7.5000 mg | ORAL_TABLET | Freq: Three times a day (TID) | ORAL | 0 refills | Status: DC | PRN

## 2020-06-26 MED ORDER — SERTRALINE HCL 25 MG PO TABS: 25.0000 mg | ORAL_TABLET | Freq: Every day | ORAL | 0 refills | Status: DC

## 2020-07-01 NOTE — Progress Notes (Signed)
Chief Complaint:   OBESITY [name] is here to discuss her progress with her obesity treatment plan along with follow-up of her obesity related diagnoses. [name] is on the Category 3 Plan and states she is following her eating plan approximately 85% of the time. [name] states she is walking for 30 minutes 3 times per week, bike riding for 30 minutes 3 times per week, and weights for 25-30 minutes 3 times per week.  Today's visit was #: 7 Starting weight: 375 lbs Starting date: 03/17/2020 Today's weight: 353 lbs Today's date: 06/26/2020 Total lbs lost to date: 22 Total lbs lost since last in-office visit: 4  Interim History: [name] felt Halloween went better than expected, she didn't eat much candy. She tried adding some extra food at breakfast and eating a snack between breakfast and lunch to help get to dinner. She is hungry especially on days she goes to the gym. She getting in 8 oz of meat at dinner. She is doing skinny pop, Fiber one bars. She is going to her mother's house for Thanksgiving. She had dental surgery this week, so she hasn't been able to tolerate much besides soft foods.   Subjective:   1. Vitamin D deficiency [name] denies nausea, vomiting, or muscle weakness, but she notes fatigue. She is on prescription Vit D 50,000 IU weekly.  2. Depression with anxiety [name] denies suicidal or homicidal ideas. She is on Zoloft 25 mg daily, and she notes she is having panic attacks.  3. At risk for osteoporosis [name] is at higher risk of osteopenia and osteoporosis due to Vitamin D deficiency.   Assessment/Plan:   1. Vitamin D deficiency Low Vitamin D level contributes to fatigue and are associated with obesity, breast, and colon cancer. We will refill prescription Vitamin D for 1 month. [name] will follow-up for routine testing of Vitamin D, at least 2-3 times per year to avoid over-replacement.  - Vitamin D, Ergocalciferol, (DRISDOL) 1.25 MG (50000 UNIT) CAPS capsule; Take  1 capsule (50,000 Units total) by mouth every 7 (seven) days.  Dispense: 4 capsule; Refill: 0  2. Depression with anxiety Behavior modification techniques were discussed today to help [name] deal with her emotional/non-hunger eating behaviors. [name] agreed to start Buspar 7.5 mg PO TID as needed, with no refills, and we will refill Zoloft for 1 month. May need to switch to Prozac if Zoloft is not managing her panic disorder. Orders and follow up as documented in patient record.   - sertraline (ZOLOFT) 25 MG tablet; Take 1 tablet (25 mg total) by mouth daily.  Dispense: 30 tablet; Refill: 0 - busPIRone (BUSPAR) 7.5 MG tablet; Take 1 tablet (7.5 mg total) by mouth 3 (three) times daily as needed (anxiety).  Dispense: 45 tablet; Refill: 0  3. At risk for osteoporosis [name] was given approximately 15 minutes of osteoporosis prevention counseling today. [name] is at risk for osteopenia and osteoporosis due to her Vitamin D deficiency. She was encouraged to take her Vitamin D and follow her higher calcium diet and increase strengthening exercise to help strengthen her bones and decrease her risk of osteopenia and osteoporosis.  Repetitive spaced learning was employed today to elicit superior memory formation and behavioral change.  4. Class 3 severe obesity with serious comorbidity and body mass index (BMI) of 50.0 to 59.9 in adult, unspecified obesity type (HCC) [name] is currently in the action stage of change. As such, her goal is to continue with weight loss efforts. She has agreed to  the Category 3 Plan with 10 oz meat or equivalent of dinner portion.   Exercise goals: As is.  Behavioral modification strategies: increasing lean protein intake, meal planning and cooking strategies and holiday eating strategies .  [name] has agreed to follow-up with our clinic in 3 to 4 weeks. She was informed of the importance of frequent follow-up visits to maximize her success with intensive lifestyle  modifications for her multiple health conditions.   Objective:   Blood pressure (!) 148/85, pulse 76, temperature 98.1 F (36.7 C), temperature source Oral, height 5\' 7"  (1.702 m), weight (!) 354 lb (160.6 kg), last menstrual period 06/22/2020, SpO2 98 %. Body mass index is 55.44 kg/m.  General: Cooperative, alert, well developed, in no acute distress. HEENT: Conjunctivae and lids unremarkable. Cardiovascular: Regular rhythm.  Lungs: Normal work of breathing. Neurologic: No focal deficits.   Lab Results  Component Value Date   CREATININE 0.87 03/17/2020   BUN 12 03/17/2020   NA 138 03/17/2020   K 4.2 03/17/2020   CL 102 03/17/2020   CO2 21 03/17/2020   Lab Results  Component Value Date   ALT 13 03/17/2020   AST 19 03/17/2020   ALKPHOS 54 03/17/2020   BILITOT 0.3 03/17/2020   Lab Results  Component Value Date   HGBA1C 5.7 (H) 03/17/2020   Lab Results  Component Value Date   INSULIN 37.9 (H) 03/17/2020   No results found for: TSH No results found for: CHOL, HDL, LDLCALC, LDLDIRECT, TRIG, CHOLHDL No results found for: WBC, HGB, HCT, MCV, PLT No results found for: IRON, TIBC, FERRITIN  Attestation Statements:   Reviewed by clinician on day of visit: allergies, medications, problem list, medical history, surgical history, family history, social history, and previous encounter notes.   I, Trixie Dredge, am acting as transcriptionist for Coralie Common, MD.  I have reviewed the above documentation for accuracy and completeness, and I agree with the above. - Jinny Blossom, MD

## 2020-07-24 ENCOUNTER — Encounter (INDEPENDENT_AMBULATORY_CARE_PROVIDER_SITE_OTHER): Payer: Self-pay | Admitting: Family Medicine

## 2020-07-24 ENCOUNTER — Ambulatory Visit (INDEPENDENT_AMBULATORY_CARE_PROVIDER_SITE_OTHER): Payer: BC Managed Care – PPO | Admitting: Family Medicine

## 2020-07-24 ENCOUNTER — Other Ambulatory Visit: Payer: Self-pay

## 2020-07-24 VITALS — BP 144/84 | HR 80 | Temp 98.0°F | Ht 67.0 in | Wt 356.0 lb

## 2020-07-24 DIAGNOSIS — F418 Other specified anxiety disorders: Secondary | ICD-10-CM

## 2020-07-24 DIAGNOSIS — Z9189 Other specified personal risk factors, not elsewhere classified: Secondary | ICD-10-CM

## 2020-07-24 DIAGNOSIS — Z6841 Body Mass Index (BMI) 40.0 and over, adult: Secondary | ICD-10-CM | POA: Diagnosis not present

## 2020-07-24 DIAGNOSIS — E559 Vitamin D deficiency, unspecified: Secondary | ICD-10-CM | POA: Diagnosis not present

## 2020-07-24 MED ORDER — VITAMIN D (ERGOCALCIFEROL) 1.25 MG (50000 UNIT) PO CAPS: 50000.0000 [IU] | ORAL_CAPSULE | ORAL | 0 refills | Status: DC

## 2020-07-24 MED ORDER — SERTRALINE HCL 50 MG PO TABS: 50.0000 mg | ORAL_TABLET | Freq: Every day | ORAL | 0 refills | Status: DC

## 2020-07-28 NOTE — Progress Notes (Signed)
Chief Complaint:   OBESITY Dixie is here to discuss her progress with her obesity treatment plan along with follow-up of her obesity related diagnoses. Maeryn is on the Category 3 Plan and states she is following her eating plan approximately 80% of the time. Aby states she is exercising at the gym 45-60 minutes 3 times per week.  Today's visit was #: 8 Starting weight: 375 lbs Starting date: 03/17/2020 Today's weight: 356 lbs Today's date: 07/24/2020 Total lbs lost to date: 19 lbs Total lbs lost since last in-office visit: 3 lbs  Interim History: Jahmiya reports that she had a decent Thanksgiving at her mom's house. She was off track for Thanksgiving and the weekend after that. She is doing eggs at breakfast, lunch is on plan, and she is trying to do meat and vegetables for dinner. Elianah states bread has been making her feel nauseous. She knows she isn't getting enough food and protein in.  Subjective:   1. Vitamin D deficiency Brindy denies nausea, vomiting, and muscle weakness but does note fatigue. Her last Vit D level was 11.8 on 03/17/2020.  2. Depression with anxiety Jesica denies suicidal or homicidal ideations. She is feeling more down and struggles at nighttime with feeling down.  Assessment/Plan:   1. Vitamin D deficiency Low Vitamin D level contributes to fatigue and are associated with obesity, breast, and colon cancer. She agrees to continue to take prescription Vitamin D @50 ,000 IU every week and will follow-up for routine testing of Vitamin D, at least 2-3 times per year to avoid over-replacement. Meggie has agreed to continue current treatment plan, and we will refill Vit D for 1 month.  - Vitamin D, Ergocalciferol, (DRISDOL) 1.25 MG (50000 UNIT) CAPS capsule; Take 1 capsule (50,000 Units total) by mouth every 7 (seven) days.  Dispense: 4 capsule; Refill: 0  2. Depression with anxiety Behavior modification techniques were discussed today to help Bettymae deal  with her emotional/non-hunger eating behaviors. Orders and follow up as documented in patient record. Philip has agreed to increase Zoloft to 50 mg by mouth daily, and we will send prescription for 1 month supply.   - sertraline (ZOLOFT) 50 MG tablet; Take 1 tablet (50 mg total) by mouth daily.  Dispense: 30 tablet; Refill: 0  3. At risk for depression Jansen was given approximately 15 minutes of depression risk counseling today. She has risk factors for depression due to the holiday season. We discussed the importance of a healthy work life balance, a healthy relationship with food and a good support system.  Repetitive spaced learning was employed today to elicit superior memory formation and behavioral change.  4. Class 3 severe obesity with serious comorbidity and body mass index (BMI) of 50.0 to 59.9 in adult, unspecified obesity type (HCC) Kimley is currently in the action stage of change. As such, her goal is to continue with weight loss efforts. She has agreed to the Category 3 Plan.   Exercise goals: As is  We discussed various medication options to help [facility] with her weight loss efforts and we both agreed to consider GLP-1 at next appointment.  Behavioral modification strategies: increasing lean protein intake, no skipping meals, meal planning and cooking strategies, celebration eating strategies and planning for success.  Miyoko has agreed to follow-up with our clinic in 4 weeks. She was informed of the importance of frequent follow-up visits to maximize her success with intensive lifestyle modifications for her multiple health conditions.   Objective:   Blood  pressure (!) 144/84, pulse 80, temperature 98 F (36.7 C), temperature source Oral, height 5\' 7"  (1.702 m), weight (!) 356 lb (161.5 kg), last menstrual period 07/20/2020, SpO2 96 %. Body mass index is 55.76 kg/m.  General: Cooperative, alert, well developed, in no acute distress. HEENT: Conjunctivae and lids  unremarkable. Cardiovascular: Regular rhythm.  Lungs: Normal work of breathing. Neurologic: No focal deficits.   Lab Results  Component Value Date   CREATININE 0.87 03/17/2020   BUN 12 03/17/2020   NA 138 03/17/2020   K 4.2 03/17/2020   CL 102 03/17/2020   CO2 21 03/17/2020   Lab Results  Component Value Date   ALT 13 03/17/2020   AST 19 03/17/2020   ALKPHOS 54 03/17/2020   BILITOT 0.3 03/17/2020   Lab Results  Component Value Date   HGBA1C 5.7 (H) 03/17/2020   Lab Results  Component Value Date   INSULIN 37.9 (H) 03/17/2020    Attestation Statements:   Reviewed by clinician on day of visit: allergies, medications, problem list, medical history, surgical history, family history, social history, and previous encounter notes.  Coral Ceo, am acting as transcriptionist for Coralie Common, MD.  I have reviewed the above documentation for accuracy and completeness, and I agree with the above. - Jinny Blossom, MD

## 2020-08-25 ENCOUNTER — Ambulatory Visit (INDEPENDENT_AMBULATORY_CARE_PROVIDER_SITE_OTHER): Payer: BC Managed Care – PPO | Admitting: Family Medicine

## 2020-09-01 ENCOUNTER — Encounter (INDEPENDENT_AMBULATORY_CARE_PROVIDER_SITE_OTHER): Payer: Self-pay

## 2020-09-02 ENCOUNTER — Telehealth (INDEPENDENT_AMBULATORY_CARE_PROVIDER_SITE_OTHER): Payer: Self-pay

## 2020-09-02 ENCOUNTER — Telehealth (INDEPENDENT_AMBULATORY_CARE_PROVIDER_SITE_OTHER): Payer: Self-pay | Admitting: Family Medicine

## 2020-09-02 ENCOUNTER — Other Ambulatory Visit: Payer: Self-pay

## 2020-09-02 ENCOUNTER — Encounter (INDEPENDENT_AMBULATORY_CARE_PROVIDER_SITE_OTHER): Payer: Self-pay | Admitting: Family Medicine

## 2020-09-02 DIAGNOSIS — E559 Vitamin D deficiency, unspecified: Secondary | ICD-10-CM

## 2020-09-02 DIAGNOSIS — Z6841 Body Mass Index (BMI) 40.0 and over, adult: Secondary | ICD-10-CM

## 2020-09-02 DIAGNOSIS — F418 Other specified anxiety disorders: Secondary | ICD-10-CM

## 2020-09-02 MED ORDER — VITAMIN D (ERGOCALCIFEROL) 1.25 MG (50000 UNIT) PO CAPS: 50000.0000 [IU] | ORAL_CAPSULE | ORAL | 0 refills | Status: DC

## 2020-09-02 MED ORDER — SERTRALINE HCL 50 MG PO TABS: 50.0000 mg | ORAL_TABLET | Freq: Every day | ORAL | 0 refills | Status: DC

## 2020-09-02 NOTE — Telephone Encounter (Signed)
I connected with  [name] on 09/02/20 by a video enabled telemedicine application and verified that I am speaking with the correct person using two identifiers.   I discussed the limitations of evaluation and management by telemedicine. The patient expressed understanding and agreed to proceed.

## 2020-09-04 NOTE — Progress Notes (Signed)
TeleHealth Visit:  Due to the COVID-19 pandemic, this visit was completed with telemedicine (audio/video) technology to reduce patient and provider exposure as well as to preserve personal protective equipment.   [name] has verbally consented to this TeleHealth visit. The patient is located at home, the provider is located at the [facility] office. The participants in this visit include the listed provider and patient. The visit was conducted today via MyChart Video.   Chief Complaint: OBESITY [name] is here to discuss her progress with her obesity treatment plan along with follow-up of her obesity related diagnoses. [name] is on the Category 3 Plan and states she is following her eating plan approximately 60% of the time. [name] states she walk 7000 steps.  Today's visit was #: 9 Starting weight: 375 lbs Starting date: 03/17/2020  Interim History: [name] reports weight at home of 362 lbs. [name] voices she is not motivated to go to gym due to being on her meal plan as consistently as she would like. [name] has been doing breakfast for lunch on plan, and has been eating out more over Christmas break.  Subjective:   1. Vitamin D deficiency   [name] denies nausea, vomiting, or muscle weakness, but notes fatigue. Her last Vitamin D was 11.8 on 03/17/2020. She is on prescription Vitamin D.  2.  Depression with anxiety [name]'s depressive symptoms worse over winter holiday. She denies suicidal or homicidal ideas.   Assessment/Plan:   1. Vitamin D deficiency Low Vitamin D level contributes to fatigue and are associated with obesity, breast, and colon cancer. She agrees to continue to take prescription Vitamin D @50 ,000 IU every week and will follow-up for routine testing of Vitamin D, at least 2-3 times per year to avoid over-replacement. Will refill Vitamin D 50,000 Unit weekly #4. No refill.   - Vitamin D, Ergocalciferol, (DRISDOL) 1.25 MG (50000 UNIT) CAPS capsule; Take 1  capsule (50,000 Units total) by mouth every 7 (seven) days.  Dispense: 4 capsule; Refill: 0  2. Depression with anxiety Behavior modification techniques were discussed today to help [name] deal with her emotional/non-hunger eating behaviors.  Orders and follow up as documented in patient record. We will refill Sertraline 50 mg po daily #30 No refill.  - sertraline (ZOLOFT) 50 MG tablet; Take 1 tablet (50 mg total) by mouth daily.  Dispense: 30 tablet; Refill: 0  3. Class 3 severe obesity with serious comorbidity and body mass index (BMI) of 50.0 to 59.9 in adult, unspecified obesity type (HCC)  [name] is currently in the action stage of change. As such, her goal is to continue with weight loss efforts. She has agreed to the Category 3 Plan.   Exercise goals: Some exercise.  Behavioral modification strategies: increasing lean protein intake.  [name] has agreed to follow-up with our clinic in 2 weeks on 09/28/2020 at 3:20 pm. She was informed of the importance of frequent follow-up visits to maximize her success with intensive lifestyle modifications for her multiple health conditions.   Objective:   VITALS: Per patient if applicable, see vitals. GENERAL: Alert and in no acute distress. CARDIOPULMONARY: No increased WOB. Speaking in clear sentences.  PSYCH: Pleasant and cooperative. Speech normal rate and rhythm. Affect is appropriate. Insight and judgement are appropriate. Attention is focused, linear, and appropriate.  NEURO: Oriented as arrived to appointment on time with no prompting.   Lab Results  Component Value Date   CREATININE 0.87 03/17/2020   BUN 12 03/17/2020   NA 138  03/17/2020   K 4.2 03/17/2020   CL 102 03/17/2020   CO2 21 03/17/2020   Lab Results  Component Value Date   ALT 13 03/17/2020   AST 19 03/17/2020   ALKPHOS 54 03/17/2020   BILITOT 0.3 03/17/2020   Lab Results  Component Value Date   HGBA1C 5.7 (H) 03/17/2020   Lab Results  Component Value Date    INSULIN 37.9 (H) 03/17/2020   No results found for: TSH No results found for: CHOL, HDL, LDLCALC, LDLDIRECT, TRIG, CHOLHDL No results found for: WBC, HGB, HCT, MCV, PLT No results found for: IRON, TIBC, FERRITIN  Attestation Statements:   Reviewed by clinician on day of visit: allergies, medications, problem list, medical history, surgical history, family history, social history, and previous encounter notes.  I, Para March, am acting as transcriptionist for Coralie Common, MD.  I have reviewed the above documentation for accuracy and completeness, and I agree with the above. - Jinny Blossom, MD

## 2020-09-18 ENCOUNTER — Telehealth (INDEPENDENT_AMBULATORY_CARE_PROVIDER_SITE_OTHER): Payer: Self-pay | Admitting: Family Medicine

## 2020-09-18 ENCOUNTER — Encounter (INDEPENDENT_AMBULATORY_CARE_PROVIDER_SITE_OTHER): Payer: Self-pay

## 2020-09-18 ENCOUNTER — Other Ambulatory Visit: Payer: Self-pay

## 2020-09-18 ENCOUNTER — Encounter (INDEPENDENT_AMBULATORY_CARE_PROVIDER_SITE_OTHER): Payer: Self-pay | Admitting: Family Medicine

## 2020-09-18 DIAGNOSIS — Z6841 Body Mass Index (BMI) 40.0 and over, adult: Secondary | ICD-10-CM

## 2020-09-18 DIAGNOSIS — F418 Other specified anxiety disorders: Secondary | ICD-10-CM

## 2020-09-18 DIAGNOSIS — E559 Vitamin D deficiency, unspecified: Secondary | ICD-10-CM

## 2020-09-18 MED ORDER — SERTRALINE HCL 50 MG PO TABS: 50.0000 mg | ORAL_TABLET | Freq: Every day | ORAL | 0 refills | Status: DC

## 2020-09-18 MED ORDER — VITAMIN D (ERGOCALCIFEROL) 1.25 MG (50000 UNIT) PO CAPS: 50000.0000 [IU] | ORAL_CAPSULE | ORAL | 0 refills | Status: DC

## 2020-09-24 NOTE — Progress Notes (Signed)
TeleHealth Visit:  Due to the COVID-19 pandemic, this visit was completed with telemedicine (audio/video) technology to reduce patient and provider exposure as well as to preserve personal protective equipment.   [name] has verbally consented to this TeleHealth visit. The patient is located at home, the provider is located at the [facility] office. The participants in this visit include the listed provider and patient. The visit was conducted today via video.   Chief Complaint: OBESITY [name] is here to discuss her progress with her obesity treatment plan along with follow-up of her obesity related diagnoses. [name] is on the Category 2 Plan and states she is following her eating plan approximately 85% of the time. [name] states she is going to the gym 60 minutes 3 times per week.  Today's visit was #: 10 Starting weight: 375 lbs Starting date: 03/17/2020  Interim History: [name] started meal prepping, which has made following the plan easier. She started painting and writing again, which is a way for her to de-stress when she is not at work. Nights she gets all protein in she notices she snacks less. She is doing some boredom snacking.  Subjective:   1. Vitamin D deficiency Pt denies nausea, vomiting, and muscle weakness but notes fatigue. Pt is on prescription Vit D.  2. Depression with anxiety Pt denies suicidal or homicidal ideations. She is on sertraline. She is not experiencing BIG emotions.  Assessment/Plan:   1. Vitamin D deficiency Low Vitamin D level contributes to fatigue and are associated with obesity, breast, and colon cancer. She agrees to continue to take prescription Vitamin D @50 ,000 IU every week and will follow-up for routine testing of Vitamin D, at least 2-3 times per year to avoid over-replacement.  - Vitamin D, Ergocalciferol, (DRISDOL) 1.25 MG (50000 UNIT) CAPS capsule; Take 1 capsule (50,000 Units total) by mouth every 7 (seven) days.   Dispense: 4 capsule; Refill: 0  2. Depression with anxiety Behavior modification techniques were discussed today to help [name] deal with her emotional/non-hunger eating behaviors.  Orders and follow up as documented in patient record.   - sertraline (ZOLOFT) 50 MG tablet; Take 1 tablet (50 mg total) by mouth daily.  Dispense: 30 tablet; Refill: 0  3. Class 3 severe obesity with serious comorbidity and body mass index (BMI) of 50.0 to 59.9 in adult, unspecified obesity type (HCC) [name] is currently in the action stage of change. As such, her goal is to continue with weight loss efforts. She has agreed to the Category 2 Plan.   Exercise goals: As is  Behavioral modification strategies: increasing lean protein intake, meal planning and cooking strategies, keeping healthy foods in the home, better snacking choices and emotional eating strategies.  [name] has agreed to follow-up with our clinic in 2 weeks. She was informed of the importance of frequent follow-up visits to maximize her success with intensive lifestyle modifications for her multiple health conditions.  Objective:   VITALS: Per patient if applicable, see vitals. GENERAL: Alert and in no acute distress. CARDIOPULMONARY: No increased WOB. Speaking in clear sentences.  PSYCH: Pleasant and cooperative. Speech normal rate and rhythm. Affect is appropriate. Insight and judgement are appropriate. Attention is focused, linear, and appropriate.  NEURO: Oriented as arrived to appointment on time with no prompting.   Lab Results  Component Value Date   CREATININE 0.87 03/17/2020   BUN 12 03/17/2020   NA 138 03/17/2020   K 4.2 03/17/2020   CL 102 03/17/2020  CO2 21 03/17/2020   Lab Results  Component Value Date   ALT 13 03/17/2020   AST 19 03/17/2020   ALKPHOS 54 03/17/2020   BILITOT 0.3 03/17/2020   Lab Results  Component Value Date   HGBA1C 5.7 (H) 03/17/2020   Lab Results  Component Value Date   INSULIN 37.9 (H)  03/17/2020   No results found for: TSH No results found for: CHOL, HDL, LDLCALC, LDLDIRECT, TRIG, CHOLHDL No results found for: WBC, HGB, HCT, MCV, PLT No results found for: IRON, TIBC, FERRITIN  Attestation Statements:   Reviewed by clinician on day of visit: allergies, medications, problem list, medical history, surgical history, family history, social history, and previous encounter notes.  Coral Ceo, am acting as transcriptionist for Coralie Common, MD.   I have reviewed the above documentation for accuracy and completeness, and I agree with the above. - Jinny Blossom, MD

## 2020-10-07 ENCOUNTER — Ambulatory Visit (INDEPENDENT_AMBULATORY_CARE_PROVIDER_SITE_OTHER): Payer: BC Managed Care – PPO | Admitting: Family Medicine

## 2020-10-07 ENCOUNTER — Other Ambulatory Visit: Payer: Self-pay

## 2020-10-07 ENCOUNTER — Encounter (INDEPENDENT_AMBULATORY_CARE_PROVIDER_SITE_OTHER): Payer: Self-pay | Admitting: Family Medicine

## 2020-10-07 VITALS — BP 147/87 | HR 83 | Temp 98.2°F | Ht 67.0 in | Wt 364.0 lb

## 2020-10-07 DIAGNOSIS — F418 Other specified anxiety disorders: Secondary | ICD-10-CM

## 2020-10-07 DIAGNOSIS — R7303 Prediabetes: Secondary | ICD-10-CM | POA: Diagnosis not present

## 2020-10-07 DIAGNOSIS — Z6841 Body Mass Index (BMI) 40.0 and over, adult: Secondary | ICD-10-CM | POA: Diagnosis not present

## 2020-10-09 NOTE — Progress Notes (Signed)
Chief Complaint:   OBESITY [name] is here to discuss her progress with her obesity treatment plan along with follow-up of her obesity related diagnoses. [name] is on the Category 3 Plan and states she is following her eating plan approximately 80-85% of the time. [name] states she is doing cardio and weights 20 minutes 3 times per week.  Today's visit was #: 11 Starting weight: 375 lbs Starting date: 03/17/2020 Today's weight: 364 lbs Today's date: 10/07/2020 Total lbs lost to date: 11 lbs Total lbs lost since last in-office visit: 0  Interim History: Since her last appointment, pt has gotten back into the meal plan, which has been helpful in keeping her on track. She is feeling non-scale victories of clothes feeling loser. 3 eggs for breakfast, 1 slice cheese, 1 slice bread and 8 oz milk. Skinny pop for snack. Lunch- 4 oz protein, fruit; Dinner- 8 oz protein and vegetables. After dinner- yogurt. She is not always getting snacks in. She is planning to go back to [place] in April.  Subjective:   1. Pre-diabetes [name]'s A1c is 5.7 with an insulin level of 37.9. She is not on medication currently and denies cravings.  2. Depression with anxiety Pt denies suicidal or homicidal ideations. Her symptoms are better on Zoloft (no recent panic attacks). She is also on Buspar.  Assessment/Plan:   1. Pre-diabetes [name] will continue to work on weight loss, exercise, and decreasing simple carbohydrates to help decrease the risk of diabetes. Continue meal plan with increase to category 4.  2. Depression with anxiety Behavior modification techniques were discussed today to help [name] deal with her emotional/non-hunger eating behaviors.  Orders and follow up as documented in patient record. Behavior modification techniques were discussed today to help [name] deal with her anxiety.  Orders and follow up as documented in patient record. Continue Zoloft and Buspar. No refills needed at this  time.  3. Class 3 severe obesity with serious comorbidity and body mass index (BMI) of 50.0 to 59.9 in adult, unspecified obesity type (HCC) [name] is currently in the action stage of change. As such, her goal is to continue with weight loss efforts. She has agreed to the Category 4 Plan with 8 oz at dinner.   Exercise goals: All adults should avoid inactivity. Some physical activity is better than none, and adults who participate in any amount of physical activity gain some health benefits.  Behavioral modification strategies: increasing lean protein intake, meal planning and cooking strategies and keeping healthy foods in the home.  [name] has agreed to follow-up with our clinic in 2-3 weeks. She was informed of the importance of frequent follow-up visits to maximize her success with intensive lifestyle modifications for her multiple health conditions.   Objective:   Blood pressure (!) 147/87, pulse 83, temperature 98.2 F (36.8 C), temperature source Oral, height 5\' 7"  (1.702 m), weight (!) 364 lb (165.1 kg), SpO2 98 %. Body mass index is 57.01 kg/m.  General: Cooperative, alert, well developed, in no acute distress. HEENT: Conjunctivae and lids unremarkable. Cardiovascular: Regular rhythm.  Lungs: Normal work of breathing. Neurologic: No focal deficits.   Lab Results  Component Value Date   CREATININE 0.87 03/17/2020   BUN 12 03/17/2020   NA 138 03/17/2020   K 4.2 03/17/2020   CL 102 03/17/2020   CO2 21 03/17/2020   Lab Results  Component Value Date   ALT 13 03/17/2020   AST 19 03/17/2020   ALKPHOS 54 03/17/2020  BILITOT 0.3 03/17/2020   Lab Results  Component Value Date   HGBA1C 5.7 (H) 03/17/2020   Lab Results  Component Value Date   INSULIN 37.9 (H) 03/17/2020   Attestation Statements:   Reviewed by clinician on day of visit: allergies, medications, problem list, medical history, surgical history, family history, social history, and previous encounter  notes.  Coral Ceo, am acting as transcriptionist for Coralie Common, MD.   I have reviewed the above documentation for accuracy and completeness, and I agree with the above. - Jinny Blossom, MD

## 2020-10-20 ENCOUNTER — Other Ambulatory Visit (INDEPENDENT_AMBULATORY_CARE_PROVIDER_SITE_OTHER): Payer: Self-pay | Admitting: Family Medicine

## 2020-10-20 DIAGNOSIS — E559 Vitamin D deficiency, unspecified: Secondary | ICD-10-CM

## 2020-10-20 DIAGNOSIS — F418 Other specified anxiety disorders: Secondary | ICD-10-CM

## 2020-10-21 ENCOUNTER — Other Ambulatory Visit: Payer: Self-pay

## 2020-10-21 ENCOUNTER — Other Ambulatory Visit (INDEPENDENT_AMBULATORY_CARE_PROVIDER_SITE_OTHER): Payer: Self-pay | Admitting: Family Medicine

## 2020-10-21 ENCOUNTER — Encounter (INDEPENDENT_AMBULATORY_CARE_PROVIDER_SITE_OTHER): Payer: Self-pay | Admitting: Family Medicine

## 2020-10-21 ENCOUNTER — Ambulatory Visit (INDEPENDENT_AMBULATORY_CARE_PROVIDER_SITE_OTHER): Payer: BC Managed Care – PPO | Admitting: Family Medicine

## 2020-10-21 VITALS — BP 126/82 | HR 83 | Temp 98.1°F | Ht 67.0 in | Wt 365.0 lb

## 2020-10-21 DIAGNOSIS — E559 Vitamin D deficiency, unspecified: Secondary | ICD-10-CM

## 2020-10-21 DIAGNOSIS — Z6841 Body Mass Index (BMI) 40.0 and over, adult: Secondary | ICD-10-CM

## 2020-10-21 DIAGNOSIS — F418 Other specified anxiety disorders: Secondary | ICD-10-CM

## 2020-10-21 DIAGNOSIS — Z9189 Other specified personal risk factors, not elsewhere classified: Secondary | ICD-10-CM

## 2020-10-21 MED ORDER — VITAMIN D (ERGOCALCIFEROL) 1.25 MG (50000 UNIT) PO CAPS: 50000.0000 [IU] | ORAL_CAPSULE | ORAL | 0 refills | Status: DC

## 2020-10-21 MED ORDER — SERTRALINE HCL 50 MG PO TABS: 50.0000 mg | ORAL_TABLET | Freq: Every day | ORAL | 0 refills | Status: DC

## 2020-10-21 NOTE — Telephone Encounter (Signed)
Last seen by Dr. Ukleja. 

## 2020-10-23 NOTE — Progress Notes (Signed)
Chief Complaint:   OBESITY [name] is here to discuss her progress with her obesity treatment plan along with follow-up of her obesity related diagnoses. [name] is on the Category 3 Plan and states she is following her eating plan approximately 90% of the time. [name] states she is doing cardio and weight lifting 60 minutes 3 times per week.  Today's visit was #: 12 Starting weight: 375 lbs Starting date: 03/17/2020 Today's weight: 366 lbs Today's date: 10/21/2020 Total lbs lost to date: 11 lbs Total lbs lost since last in-office visit: 0  Interim History: Pt has followed meal plan more strictly over the last few weeks. She feels her clothes are looser and she is feeling much better. Breakfast- Pt is doing eggs and water, then does a protein snack mid afternoon and doing skinny pop as a snack. She is doing 8 oz protein and 2 cups of vegetables at dinner, then doing [place] yogurt and skinny pop as snack and occasionally [place] jerky. She has quite a bit to do in March both professionally and personally.  Subjective:   1. Depression with anxiety Pt denies suicidal or homicidal ideations. Her symptoms are better controlled. She is on sertraline 50 mg daily.  2. Vitamin D deficiency Pt denies nausea, vomiting, and muscle weakness but notes fatigue. Pt is on prescription Vit D.  3. At risk for osteoporosis Brande is at higher risk of osteopenia and osteoporosis due to Vitamin D deficiency.   Assessment/Plan:   1. Depression with anxiety Behavior modification techniques were discussed today to help [name] deal with her emotional/non-hunger eating behaviors.  Orders and follow up as documented in patient record.   - sertraline (ZOLOFT) 50 MG tablet; Take 1 tablet (50 mg total) by mouth daily.  Dispense: 30 tablet; Refill: 0  2. Vitamin D deficiency Low Vitamin D level contributes to fatigue and are associated with obesity, breast, and colon cancer. She agrees to continue to take  prescription Vitamin D @50 ,000 IU every week and will follow-up for routine testing of Vitamin D, at least 2-3 times per year to avoid over-replacement.  - Vitamin D, Ergocalciferol, (DRISDOL) 1.25 MG (50000 UNIT) CAPS capsule; Take 1 capsule (50,000 Units total) by mouth every 7 (seven) days.  Dispense: 4 capsule; Refill: 0  3. At risk for osteoporosis Jamicia was given approximately 15 minutes of osteoporosis prevention counseling today. [name] is at risk for osteopenia and osteoporosis due to her Vitamin D deficiency. She was encouraged to take her Vitamin D and follow her higher calcium diet and increase strengthening exercise to help strengthen her bones and decrease her risk of osteopenia and osteoporosis.  Repetitive spaced learning was employed today to elicit superior memory formation and behavioral change.  4. Class 3 severe obesity with serious comorbidity and body mass index (BMI) of 50.0 to 59.9 in adult, unspecified obesity type (HCC) [name] is currently in the action stage of change. As such, her goal is to continue with weight loss efforts. She has agreed to the Category 3 Plan with 10 oz protein at dinner.   Exercise goals: As is  Behavioral modification strategies: increasing lean protein intake, meal planning and cooking strategies, keeping healthy foods in the home and emotional eating strategies.  [name] has agreed to follow-up with our clinic in 2 weeks. She was informed of the importance of frequent follow-up visits to maximize her success with intensive lifestyle modifications for her multiple health conditions.   Objective:   Blood pressure 126/82, pulse  83, temperature 98.1 F (36.7 C), temperature source Oral, height 5\' 7"  (1.702 m), weight (!) 365 lb (165.6 kg), SpO2 97 %. Body mass index is 57.17 kg/m.  General: Cooperative, alert, well developed, in no acute distress. HEENT: Conjunctivae and lids unremarkable. Cardiovascular: Regular rhythm.  Lungs: Normal  work of breathing. Neurologic: No focal deficits.   Lab Results  Component Value Date   CREATININE 0.87 03/17/2020   BUN 12 03/17/2020   NA 138 03/17/2020   K 4.2 03/17/2020   CL 102 03/17/2020   CO2 21 03/17/2020   Lab Results  Component Value Date   ALT 13 03/17/2020   AST 19 03/17/2020   ALKPHOS 54 03/17/2020   BILITOT 0.3 03/17/2020   Lab Results  Component Value Date   HGBA1C 5.7 (H) 03/17/2020   Lab Results  Component Value Date   INSULIN 37.9 (H) 03/17/2020   No results found for: TSH No results found for: CHOL, HDL, LDLCALC, LDLDIRECT, TRIG, CHOLHDL No results found for: WBC, HGB, HCT, MCV, PLT No results found for: IRON, TIBC, FERRITIN  Attestation Statements:   Reviewed by clinician on day of visit: allergies, medications, problem list, medical history, surgical history, family history, social history, and previous encounter notes.  Coral Ceo, am acting as transcriptionist for Coralie Common, MD.   I have reviewed the above documentation for accuracy and completeness, and I agree with the above. - Jinny Blossom, MD

## 2020-10-30 ENCOUNTER — Encounter (INDEPENDENT_AMBULATORY_CARE_PROVIDER_SITE_OTHER): Payer: Self-pay | Admitting: Family Medicine

## 2020-11-03 NOTE — Telephone Encounter (Signed)
L/mess, need more info-CAS

## 2020-11-03 NOTE — Telephone Encounter (Signed)
Last seen by Dr. Ukleja. 

## 2020-11-06 ENCOUNTER — Other Ambulatory Visit: Payer: Self-pay

## 2020-11-06 ENCOUNTER — Encounter (INDEPENDENT_AMBULATORY_CARE_PROVIDER_SITE_OTHER): Payer: Self-pay | Admitting: Family Medicine

## 2020-11-06 ENCOUNTER — Ambulatory Visit (INDEPENDENT_AMBULATORY_CARE_PROVIDER_SITE_OTHER): Payer: BC Managed Care – PPO | Admitting: Family Medicine

## 2020-11-06 ENCOUNTER — Other Ambulatory Visit (INDEPENDENT_AMBULATORY_CARE_PROVIDER_SITE_OTHER): Payer: Self-pay | Admitting: Family Medicine

## 2020-11-06 VITALS — BP 127/87 | HR 84 | Temp 98.4°F | Ht 67.0 in | Wt 368.0 lb

## 2020-11-06 DIAGNOSIS — R632 Polyphagia: Secondary | ICD-10-CM

## 2020-11-06 DIAGNOSIS — R7303 Prediabetes: Secondary | ICD-10-CM

## 2020-11-06 DIAGNOSIS — Z9189 Other specified personal risk factors, not elsewhere classified: Secondary | ICD-10-CM

## 2020-11-06 DIAGNOSIS — Z6841 Body Mass Index (BMI) 40.0 and over, adult: Secondary | ICD-10-CM | POA: Diagnosis not present

## 2020-11-06 MED ORDER — METFORMIN HCL 500 MG PO TABS: 500.0000 mg | ORAL_TABLET | Freq: Every day | ORAL | 0 refills | Status: DC

## 2020-11-06 MED ORDER — OZEMPIC (0.25 OR 0.5 MG/DOSE) 2 MG/1.5ML ~~LOC~~ SOPN: 0.2500 mg | PEN_INJECTOR | SUBCUTANEOUS | 0 refills | Status: DC

## 2020-11-11 NOTE — Progress Notes (Signed)
Chief Complaint:   OBESITY [name] is here to discuss her progress with her obesity treatment plan along with follow-up of her obesity related diagnoses. [name] is on the Category 3 Plan and states she is following her eating plan approximately 85% of the time. [name] states she is lifting weights, walking, and stationary bike 1-15 minutes ? times per week.  Today's visit was #: 35 Starting weight: 375 lbs Starting date: 03/17/2020 Today's weight: 368 lbs Today's date: 11/06/2020 Total lbs lost to date: 7 lbs Total lbs lost since last in-office visit: 0  Interim History: [name] is following the meal plan more strictly than the previous few weeks but is confused with weight gain. She is getting all protein in at dinner but choosing options on plan when eating out. She denies hunger but has some cravings between lunch and dinner. Work is fairly stressful, and she is trying to handle stressors as they come.  Subjective:   1. Pre-diabetes [name]'s last A1c was 5.7 and insulin level 37.9. She reports occasional carb cravings in the evenings after dinner.  Lab Results  Component Value Date   HGBA1C 5.7 (H) 03/17/2020   Lab Results  Component Value Date   INSULIN 37.9 (H) 03/17/2020    2. Polyphagia [name] reports occasional drive to eat, especially after dinner. She does have control most of the time.  3. At risk for diabetes mellitus [name] is at higher than average risk for developing diabetes due to obesity.   Assessment/Plan:   1. Pre-diabetes [name] will continue to work on weight loss, exercise, and decreasing simple carbohydrates to help decrease the risk of diabetes. Start Metformin 500 mg, as per below.  - metFORMIN (GLUCOPHAGE) 500 MG tablet; Take 1 tablet (500 mg total) by mouth daily with breakfast.  Dispense: 30 tablet; Refill: 0  2. Polyphagia Intensive lifestyle modifications are the first line treatment for this issue. We discussed several lifestyle  modifications today and she will continue to work on diet, exercise and weight loss efforts. Orders and follow up as documented in patient record. Start Ozempic 0.25 mg, as per below.  Counseling . Polyphagia is excessive hunger. . Causes can include: low blood sugars, hypERthyroidism, PMS, lack of sleep, stress, insulin resistance, diabetes, certain medications, and diets that are deficient in protein and fiber.   - Semaglutide,0.25 or 0.5MG /DOS, (OZEMPIC, 0.25 OR 0.5 MG/DOSE,) 2 MG/1.5ML SOPN; Inject 0.25 mg into the skin once a week.  Dispense: 1.5 mL; Refill: 0  3. At risk for diabetes mellitus [name] was given approximately 15 minutes of diabetes education and counseling today. We discussed intensive lifestyle modifications today with an emphasis on weight loss as well as increasing exercise and decreasing simple carbohydrates in her diet. We also reviewed medication options with an emphasis on risk versus benefit of those discussed.   Repetitive spaced learning was employed today to elicit superior memory formation and behavioral change.  4. Obesity with current BMI of 57 [name] is currently in the action stage of change. As such, her goal is to continue with weight loss efforts. She has agreed to the Category 3 Plan.   Exercise goals: As is  Behavioral modification strategies: increasing lean protein intake, meal planning and cooking strategies, keeping healthy foods in the home and planning for success.  [name] has agreed to follow-up with our clinic in 2-3 weeks. She was informed of the importance of frequent follow-up visits to maximize her success with intensive lifestyle modifications for her multiple health  conditions.   Objective:   Blood pressure 127/87, pulse 84, temperature 98.4 F (36.9 C), temperature source Oral, height 5\' 7"  (1.702 m), weight (!) 368 lb (166.9 kg), last menstrual period 10/13/2020, SpO2 97 %. Body mass index is 57.64 kg/m.  General: Cooperative,  alert, well developed, in no acute distress. HEENT: Conjunctivae and lids unremarkable. Cardiovascular: Regular rhythm.  Lungs: Normal work of breathing. Neurologic: No focal deficits.   Lab Results  Component Value Date   CREATININE 0.87 03/17/2020   BUN 12 03/17/2020   NA 138 03/17/2020   K 4.2 03/17/2020   CL 102 03/17/2020   CO2 21 03/17/2020   Lab Results  Component Value Date   ALT 13 03/17/2020   AST 19 03/17/2020   ALKPHOS 54 03/17/2020   BILITOT 0.3 03/17/2020   Lab Results  Component Value Date   HGBA1C 5.7 (H) 03/17/2020   Lab Results  Component Value Date   INSULIN 37.9 (H) 03/17/2020   No results found for: TSH No results found for: CHOL, HDL, LDLCALC, LDLDIRECT, TRIG, CHOLHDL No results found for: WBC, HGB, HCT, MCV, PLT No results found for: IRON, TIBC, FERRITIN  Attestation Statements:   Reviewed by clinician on day of visit: allergies, medications, problem list, medical history, surgical history, family history, social history, and previous encounter notes.  Coral Ceo, am acting as transcriptionist for Coralie Common, MD.   I have reviewed the above documentation for accuracy and completeness, and I agree with the above. - Jinny Blossom, MD

## 2020-11-17 ENCOUNTER — Telehealth (INDEPENDENT_AMBULATORY_CARE_PROVIDER_SITE_OTHER): Payer: Self-pay

## 2020-11-17 NOTE — Telephone Encounter (Signed)
Cover My Meds [id number] Regarding Prior Auth for Ozempic. Ref #[id number]

## 2020-11-18 NOTE — Telephone Encounter (Signed)
Dr.Ukleja 

## 2020-11-26 ENCOUNTER — Ambulatory Visit (INDEPENDENT_AMBULATORY_CARE_PROVIDER_SITE_OTHER): Payer: BC Managed Care – PPO | Admitting: Family Medicine

## 2020-11-28 ENCOUNTER — Other Ambulatory Visit (INDEPENDENT_AMBULATORY_CARE_PROVIDER_SITE_OTHER): Payer: Self-pay | Admitting: Family Medicine

## 2020-11-28 DIAGNOSIS — E559 Vitamin D deficiency, unspecified: Secondary | ICD-10-CM

## 2020-11-28 DIAGNOSIS — F418 Other specified anxiety disorders: Secondary | ICD-10-CM

## 2020-12-01 ENCOUNTER — Other Ambulatory Visit (INDEPENDENT_AMBULATORY_CARE_PROVIDER_SITE_OTHER): Payer: Self-pay | Admitting: Family Medicine

## 2020-12-01 DIAGNOSIS — E559 Vitamin D deficiency, unspecified: Secondary | ICD-10-CM

## 2020-12-01 NOTE — Telephone Encounter (Signed)
Last seen by Dr. Ukleja. 

## 2020-12-05 ENCOUNTER — Other Ambulatory Visit (INDEPENDENT_AMBULATORY_CARE_PROVIDER_SITE_OTHER): Payer: Self-pay | Admitting: Family Medicine

## 2020-12-05 DIAGNOSIS — R632 Polyphagia: Secondary | ICD-10-CM

## 2020-12-05 DIAGNOSIS — R7303 Prediabetes: Secondary | ICD-10-CM

## 2020-12-08 NOTE — Telephone Encounter (Signed)
Dr.Ukleja 

## 2020-12-11 ENCOUNTER — Other Ambulatory Visit (INDEPENDENT_AMBULATORY_CARE_PROVIDER_SITE_OTHER): Payer: Self-pay | Admitting: Family Medicine

## 2020-12-11 DIAGNOSIS — R7303 Prediabetes: Secondary | ICD-10-CM

## 2020-12-15 ENCOUNTER — Ambulatory Visit (INDEPENDENT_AMBULATORY_CARE_PROVIDER_SITE_OTHER): Payer: BC Managed Care – PPO | Admitting: Family Medicine

## 2020-12-15 ENCOUNTER — Encounter (INDEPENDENT_AMBULATORY_CARE_PROVIDER_SITE_OTHER): Payer: Self-pay | Admitting: Family Medicine

## 2020-12-15 ENCOUNTER — Other Ambulatory Visit: Payer: Self-pay

## 2020-12-15 VITALS — BP 135/72 | HR 81 | Temp 97.4°F | Ht 67.0 in | Wt 365.0 lb

## 2020-12-15 DIAGNOSIS — I10 Essential (primary) hypertension: Secondary | ICD-10-CM | POA: Diagnosis not present

## 2020-12-15 DIAGNOSIS — E559 Vitamin D deficiency, unspecified: Secondary | ICD-10-CM

## 2020-12-15 DIAGNOSIS — F418 Other specified anxiety disorders: Secondary | ICD-10-CM

## 2020-12-15 DIAGNOSIS — Z6841 Body Mass Index (BMI) 40.0 and over, adult: Secondary | ICD-10-CM

## 2020-12-15 DIAGNOSIS — R7303 Prediabetes: Secondary | ICD-10-CM | POA: Diagnosis not present

## 2020-12-16 NOTE — Progress Notes (Signed)
Chief Complaint:   OBESITY [name] is here to discuss her progress with her obesity treatment plan along with follow-up of her obesity related diagnoses. [name] is on the Category 3 Plan and states she is following her eating plan approximately 80% of the time. [name] states she is not currently exercising.  Today's visit was #: 14 Starting weight: 375 lbs Starting date: 03/17/2020 Today's weight: 365 lbs Today's date: 12/15/2020 Total lbs lost to date: 10 Total lbs lost since last in-office visit: 3  Interim History: [name] is skipping breakfast then eating breakfast and the rest of food during the day. She is practicing intermittent fasting. Her son is cooking for Mother's Day. Pt does like the Ozempic and the increased control she's getting from it. She is starting to see food as fuel and in better control of choosing foods.  Subjective:   1. Pre-diabetes [name] had significant GI side effects of Metformin. Her last A1c was 5.7 and insulin level 37.9.  2. Depression with anxiety Pt denies suicidal or homicidal ideations. [name] is on Zoloft 50 mg daily.  3. HTN (hypertension), benign [name]'s BP is well controlled today. Pt denies chest pain, chest pressure and headache.  Assessment/Plan:   1. Pre-diabetes [name] will continue to work on weight loss, exercise, and decreasing simple carbohydrates to help decrease the risk of diabetes. Stop Metformin. Continue Ozempic.  2. Depression with anxiety Behavior modification techniques were discussed today to help [name] deal with her emotional/non-hunger eating behaviors.  Orders and follow up as documented in patient record. Continue Zoloft. Pt denies need for refill today.  3. HTN (hypertension), benign [name] is working on healthy weight loss and exercise to improve blood pressure control. We will watch for signs of hypotension as she continues her lifestyle modifications. Continue amlodipine with no change in dose. Pt denies need  for refill today.  4. Obesity with current BMI of 57  [name] is currently in the action stage of change. As such, her goal is to continue with weight loss efforts. She has agreed to the Category 3 Plan.   Exercise goals: No exercise has been prescribed at this time.  Behavioral modification strategies: increasing lean protein intake, meal planning and cooking strategies and keeping healthy foods in the home.  [name] has agreed to follow-up with our clinic in 3 weeks. She was informed of the importance of frequent follow-up visits to maximize her success with intensive lifestyle modifications for her multiple health conditions.   Objective:   Blood pressure 135/72, pulse 81, temperature (!) 97.4 F (36.3 C), height 5\' 7"  (1.702 m), weight (!) 365 lb (165.6 kg), SpO2 96 %. Body mass index is 57.17 kg/m.  General: Cooperative, alert, well developed, in no acute distress. HEENT: Conjunctivae and lids unremarkable. Cardiovascular: Regular rhythm.  Lungs: Normal work of breathing. Neurologic: No focal deficits.   Lab Results  Component Value Date   CREATININE 0.87 03/17/2020   BUN 12 03/17/2020   NA 138 03/17/2020   K 4.2 03/17/2020   CL 102 03/17/2020   CO2 21 03/17/2020   Lab Results  Component Value Date   ALT 13 03/17/2020   AST 19 03/17/2020   ALKPHOS 54 03/17/2020   BILITOT 0.3 03/17/2020   Lab Results  Component Value Date   HGBA1C 5.7 (H) 03/17/2020   Lab Results  Component Value Date   INSULIN 37.9 (H) 03/17/2020   No results found for: TSH No results found for: CHOL, HDL, LDLCALC, LDLDIRECT, TRIG, CHOLHDL  No results found for: WBC, HGB, HCT, MCV, PLT No results found for: IRON, TIBC, FERRITIN Attestation Statements:   Reviewed by clinician on day of visit: allergies, medications, problem list, medical history, surgical history, family history, social history, and previous encounter notes.  Time spent on visit including pre-visit chart review and post-visit  care and charting was 15 minutes.   Coral Ceo, am acting as transcriptionist for Coralie Common, MD.   I have reviewed the above documentation for accuracy and completeness, and I agree with the above. - Jinny Blossom, MD

## 2020-12-28 ENCOUNTER — Other Ambulatory Visit (INDEPENDENT_AMBULATORY_CARE_PROVIDER_SITE_OTHER): Payer: Self-pay | Admitting: Family Medicine

## 2020-12-28 DIAGNOSIS — F418 Other specified anxiety disorders: Secondary | ICD-10-CM

## 2020-12-28 DIAGNOSIS — E559 Vitamin D deficiency, unspecified: Secondary | ICD-10-CM

## 2020-12-29 ENCOUNTER — Other Ambulatory Visit (INDEPENDENT_AMBULATORY_CARE_PROVIDER_SITE_OTHER): Payer: Self-pay | Admitting: Family Medicine

## 2020-12-29 DIAGNOSIS — E559 Vitamin D deficiency, unspecified: Secondary | ICD-10-CM

## 2021-01-05 ENCOUNTER — Other Ambulatory Visit: Payer: Self-pay

## 2021-01-05 ENCOUNTER — Telehealth (INDEPENDENT_AMBULATORY_CARE_PROVIDER_SITE_OTHER): Payer: Self-pay | Admitting: Family Medicine

## 2021-01-05 ENCOUNTER — Encounter (INDEPENDENT_AMBULATORY_CARE_PROVIDER_SITE_OTHER): Payer: Self-pay | Admitting: Family Medicine

## 2021-01-05 DIAGNOSIS — R632 Polyphagia: Secondary | ICD-10-CM

## 2021-01-05 DIAGNOSIS — Z6841 Body Mass Index (BMI) 40.0 and over, adult: Secondary | ICD-10-CM

## 2021-01-05 DIAGNOSIS — E559 Vitamin D deficiency, unspecified: Secondary | ICD-10-CM

## 2021-01-05 MED ORDER — OZEMPIC (0.25 OR 0.5 MG/DOSE) 2 MG/1.5ML ~~LOC~~ SOPN: 0.5000 mg | PEN_INJECTOR | SUBCUTANEOUS | 0 refills | Status: DC

## 2021-01-06 NOTE — Progress Notes (Signed)
TeleHealth Visit:  Due to the COVID-19 pandemic, this visit was completed with telemedicine (audio/video) technology to reduce patient and provider exposure as well as to preserve personal protective equipment.   [name] has verbally consented to this TeleHealth visit. The patient is located at home, the provider is located at the [facility] office. The participants in this visit include the listed provider and patient. The visit was conducted today via video.  Chief Complaint: OBESITY [name] is here to discuss her progress with her obesity treatment plan along with follow-up of her obesity related diagnoses. [name] is on the Category 3 Plan and states she is following her eating plan approximately 85% of the time. [name] states she is walking and stationary bike 60 minutes 3 times per week.  Today's visit was #: 15 Starting weight: 375 lbs Starting date: 03/17/2020  Interim History: [name] is currently ill with the flu- prior to that she was dealing with sciatica. She hasn't weighed since last week due to illness. Cravings are controlled. Appetite is minimal. She is doing quite a bit of clear liquids, protein, and vegetables at dinner. Appetite and abdominal issues are not tied to Ozempic use.  Subjective:   1. Polyphagia [name]'s last A1c was 5.7 and insulin level 37.9. Her appetite is better controlled with Ozempic.  2. Vitamin D deficiency [name]'s last Vit D level was 11.8. Pt denies nausea, vomiting, and muscle weakness but notes fatigue.   Assessment/Plan:   1. Polyphagia Intensive lifestyle modifications are the first line treatment for this issue. We discussed several lifestyle modifications today and she will continue to work on diet, exercise and weight loss efforts. Orders and follow up as documented in patient record.  Counseling . Polyphagia is excessive hunger. . Causes can include: low blood sugars, hypERthyroidism, PMS, lack of sleep, stress, insulin  resistance, diabetes, certain medications, and diets that are deficient in protein and fiber.  - Semaglutide,0.25 or 0.5MG /DOS, (OZEMPIC, 0.25 OR 0.5 MG/DOSE,) 2 MG/1.5ML SOPN; Inject 0.5 mg into the skin once a week.  Dispense: 1.5 mL; Refill: 0  2. Vitamin D deficiency Low Vitamin D level contributes to fatigue and are associated with obesity, breast, and colon cancer. She agrees to continue to take prescription Vitamin D @50 ,000 IU every week and will follow-up for routine testing of Vitamin D, at least 2-3 times per year to avoid over-replacement. No refill needed today.  3. Obesity with current BMI of 57 [name] is currently in the action stage of change. As such, her goal is to continue with weight loss efforts. She has agreed to the Category 3 Plan.   Exercise goals: All adults should avoid inactivity. Some physical activity is better than none, and adults who participate in any amount of physical activity gain some health benefits.  Behavioral modification strategies: increasing lean protein intake, meal planning and cooking strategies, keeping healthy foods in the home and planning for success.  [name] has agreed to follow-up with our clinic in 3 weeks. She was informed of the importance of frequent follow-up visits to maximize her success with intensive lifestyle modifications for her multiple health conditions.  Objective:   VITALS: Per patient if applicable, see vitals. GENERAL: Alert and in no acute distress. CARDIOPULMONARY: No increased WOB. Speaking in clear sentences.  PSYCH: Pleasant and cooperative. Speech normal rate and rhythm. Affect is appropriate. Insight and judgement are appropriate. Attention is focused, linear, and appropriate.  NEURO: Oriented as arrived to appointment on time with no prompting.  Lab Results  Component Value Date   CREATININE 0.87 03/17/2020   BUN 12 03/17/2020   NA 138 03/17/2020   K 4.2 03/17/2020   CL 102 03/17/2020   CO2 21 03/17/2020    Lab Results  Component Value Date   ALT 13 03/17/2020   AST 19 03/17/2020   ALKPHOS 54 03/17/2020   BILITOT 0.3 03/17/2020   Lab Results  Component Value Date   HGBA1C 5.7 (H) 03/17/2020   Lab Results  Component Value Date   INSULIN 37.9 (H) 03/17/2020   No results found for: TSH No results found for: CHOL, HDL, LDLCALC, LDLDIRECT, TRIG, CHOLHDL No results found for: WBC, HGB, HCT, MCV, PLT No results found for: IRON, TIBC, FERRITIN  Attestation Statements:   Reviewed by clinician on day of visit: allergies, medications, problem list, medical history, surgical history, family history, social history, and previous encounter notes.  Coral Ceo, CMA, am acting as transcriptionist for Coralie Common, MD.   I have reviewed the above documentation for accuracy and completeness, and I agree with the above. - Jinny Blossom, MD

## 2021-01-24 ENCOUNTER — Other Ambulatory Visit (INDEPENDENT_AMBULATORY_CARE_PROVIDER_SITE_OTHER): Payer: Self-pay | Admitting: Family Medicine

## 2021-01-24 DIAGNOSIS — F418 Other specified anxiety disorders: Secondary | ICD-10-CM

## 2021-01-24 DIAGNOSIS — E559 Vitamin D deficiency, unspecified: Secondary | ICD-10-CM

## 2021-01-26 NOTE — Telephone Encounter (Signed)
Dr.Ukleja 

## 2021-01-27 ENCOUNTER — Encounter (INDEPENDENT_AMBULATORY_CARE_PROVIDER_SITE_OTHER): Payer: Self-pay

## 2021-01-27 NOTE — Telephone Encounter (Signed)
Contacted pt to schedule appt prior to refilling medications.

## 2021-02-02 ENCOUNTER — Encounter (INDEPENDENT_AMBULATORY_CARE_PROVIDER_SITE_OTHER): Payer: Self-pay

## 2021-02-02 ENCOUNTER — Ambulatory Visit (INDEPENDENT_AMBULATORY_CARE_PROVIDER_SITE_OTHER): Payer: Self-pay | Admitting: Family Medicine

## 2021-02-04 ENCOUNTER — Ambulatory Visit (INDEPENDENT_AMBULATORY_CARE_PROVIDER_SITE_OTHER): Payer: BC Managed Care – PPO | Admitting: Family Medicine

## 2021-02-04 ENCOUNTER — Encounter (INDEPENDENT_AMBULATORY_CARE_PROVIDER_SITE_OTHER): Payer: Self-pay | Admitting: Family Medicine

## 2021-02-04 ENCOUNTER — Other Ambulatory Visit: Payer: Self-pay

## 2021-02-04 ENCOUNTER — Other Ambulatory Visit (INDEPENDENT_AMBULATORY_CARE_PROVIDER_SITE_OTHER): Payer: Self-pay | Admitting: Family Medicine

## 2021-02-04 VITALS — BP 137/88 | HR 70 | Temp 98.0°F | Ht 67.0 in | Wt 361.0 lb

## 2021-02-04 DIAGNOSIS — E559 Vitamin D deficiency, unspecified: Secondary | ICD-10-CM

## 2021-02-04 DIAGNOSIS — Z9189 Other specified personal risk factors, not elsewhere classified: Secondary | ICD-10-CM

## 2021-02-04 DIAGNOSIS — F418 Other specified anxiety disorders: Secondary | ICD-10-CM

## 2021-02-04 DIAGNOSIS — Z6841 Body Mass Index (BMI) 40.0 and over, adult: Secondary | ICD-10-CM

## 2021-02-04 MED ORDER — VITAMIN D (ERGOCALCIFEROL) 1.25 MG (50000 UNIT) PO CAPS
1.0000 | ORAL_CAPSULE | ORAL | 0 refills | Status: DC
Start: 2021-02-04 — End: 2021-02-24

## 2021-02-04 MED ORDER — BUSPIRONE HCL 7.5 MG PO TABS: 7.5000 mg | ORAL_TABLET | Freq: Three times a day (TID) | ORAL | 0 refills | Status: DC | PRN

## 2021-02-05 NOTE — Progress Notes (Signed)
Chief Complaint:   OBESITY [name] is here to discuss her progress with her obesity treatment plan along with follow-up of her obesity related diagnoses. [name] is on the Category 3 Plan and states she is following her eating plan approximately 80% of the time. [name] states she is doing Pilates, Yoga, and free weights 45 minutes 3 times per week.  Today's visit was #: 2 Starting weight: 375 lbs Starting date: 03/17/2020 Today's weight: 361 lbs Today's date: 02/04/2021 Total lbs lost to date: 14 Total lbs lost since last in-office visit: 4  Interim History: [name] feels she really has gotten a handle on what her body needs and how to feel her body. She is sleeping better and moving better. She has some occasional stomach issues. She is enjoying how Ozempic is helping her eating.  Subjective:   1. Depression with anxiety Pt denies suicidal or homicidal ideations. [name] is on Zoloft and Buspar. Her symptoms are well controlled with medications.  2. Vitamin D deficiency [name] denies nausea, vomiting, and muscle weakness but notes fatigue. Pt is on prescription Vit D.  3. At risk for osteoporosis [name] is at higher risk of osteopenia and osteoporosis due to Vitamin D deficiency.   Assessment/Plan:   1. Depression with anxiety Behavior modification techniques were discussed today to help [name] deal with her emotional/non-hunger eating behaviors.  Orders and follow up as documented in patient record.   - busPIRone (BUSPAR) 7.5 MG tablet; Take 1 tablet (7.5 mg total) by mouth 3 (three) times daily as needed (anxiety).  Dispense: 90 tablet; Refill: 0  2. Vitamin D deficiency Low Vitamin D level contributes to fatigue and are associated with obesity, breast, and colon cancer. She agrees to continue to take prescription Vitamin D @50 ,000 IU every week and will follow-up for routine testing of Vitamin D, at least 2-3 times per year to avoid over-replacement.  - Vitamin D,  Ergocalciferol, (DRISDOL) 1.25 MG (50000 UNIT) CAPS capsule; Take 1 capsule (50,000 Units total) by mouth every 7 (seven) days.  Dispense: 4 capsule; Refill: 0  3. At risk for osteoporosis [name] was given approximately 15 minutes of osteoporosis prevention counseling today. [name] is at risk for osteopenia and osteoporosis due to her Vitamin D deficiency. She was encouraged to take her Vitamin D and follow her higher calcium diet and increase strengthening exercise to help strengthen her bones and decrease her risk of osteopenia and osteoporosis.  Repetitive spaced learning was employed today to elicit superior memory formation and behavioral change.  4. Class 3 severe obesity with serious comorbidity and body mass index (BMI) of 50.0 to 59.9 in adult, unspecified obesity type (HCC)  [name] is currently in the action stage of change. As such, her goal is to continue with weight loss efforts. She has agreed to the Category 3 Plan.   Exercise goals:  As is  Behavioral modification strategies: increasing lean protein intake, meal planning and cooking strategies, keeping healthy foods in the home, and planning for success.  [name] has agreed to follow-up with our clinic in 3 weeks. She was informed of the importance of frequent follow-up visits to maximize her success with intensive lifestyle modifications for her multiple health conditions.   Objective:   Blood pressure 137/88, pulse 70, temperature 98 F (36.7 C), height 5\' 7"  (1.702 m), weight (!) 361 lb (163.7 kg), SpO2 97 %. Body mass index is 56.54 kg/m.  General: Cooperative, alert, well developed, in no acute distress. HEENT: Conjunctivae and lids  unremarkable. Cardiovascular: Regular rhythm.  Lungs: Normal work of breathing. Neurologic: No focal deficits.   Lab Results  Component Value Date   CREATININE 0.87 03/17/2020   BUN 12 03/17/2020   NA 138 03/17/2020   K 4.2 03/17/2020   CL 102 03/17/2020   CO2 21 03/17/2020   Lab  Results  Component Value Date   ALT 13 03/17/2020   AST 19 03/17/2020   ALKPHOS 54 03/17/2020   BILITOT 0.3 03/17/2020   Lab Results  Component Value Date   HGBA1C 5.7 (H) 03/17/2020   Lab Results  Component Value Date   INSULIN 37.9 (H) 03/17/2020   No results found for: TSH No results found for: CHOL, HDL, LDLCALC, LDLDIRECT, TRIG, CHOLHDL No results found for: WBC, HGB, HCT, MCV, PLT No results found for: IRON, TIBC, FERRITIN   Attestation Statements:   Reviewed by clinician on day of visit: allergies, medications, problem list, medical history, surgical history, family history, social history, and previous encounter notes.  Coral Ceo, CMA, am acting as transcriptionist for Coralie Common, MD.  I have reviewed the above documentation for accuracy and completeness, and I agree with the above. - Jinny Blossom, MD

## 2021-02-07 ENCOUNTER — Encounter (INDEPENDENT_AMBULATORY_CARE_PROVIDER_SITE_OTHER): Payer: Self-pay | Admitting: Family Medicine

## 2021-02-09 NOTE — Telephone Encounter (Signed)
Please advise 

## 2021-02-24 ENCOUNTER — Ambulatory Visit (INDEPENDENT_AMBULATORY_CARE_PROVIDER_SITE_OTHER): Payer: BC Managed Care – PPO | Admitting: Family Medicine

## 2021-02-24 ENCOUNTER — Other Ambulatory Visit: Payer: Self-pay

## 2021-02-24 ENCOUNTER — Encounter (INDEPENDENT_AMBULATORY_CARE_PROVIDER_SITE_OTHER): Payer: Self-pay | Admitting: Family Medicine

## 2021-02-24 VITALS — BP 129/84 | HR 77 | Temp 98.1°F | Ht 67.0 in | Wt 363.0 lb

## 2021-02-24 DIAGNOSIS — Z9189 Other specified personal risk factors, not elsewhere classified: Secondary | ICD-10-CM | POA: Diagnosis not present

## 2021-02-24 DIAGNOSIS — R632 Polyphagia: Secondary | ICD-10-CM | POA: Diagnosis not present

## 2021-02-24 DIAGNOSIS — F418 Other specified anxiety disorders: Secondary | ICD-10-CM

## 2021-02-24 DIAGNOSIS — E559 Vitamin D deficiency, unspecified: Secondary | ICD-10-CM | POA: Diagnosis not present

## 2021-02-24 DIAGNOSIS — Z6841 Body Mass Index (BMI) 40.0 and over, adult: Secondary | ICD-10-CM

## 2021-02-24 MED ORDER — OZEMPIC (0.25 OR 0.5 MG/DOSE) 2 MG/1.5ML ~~LOC~~ SOPN: 0.5000 mg | PEN_INJECTOR | SUBCUTANEOUS | 0 refills | Status: DC

## 2021-02-24 MED ORDER — VITAMIN D (ERGOCALCIFEROL) 1.25 MG (50000 UNIT) PO CAPS
50000.0000 [IU] | ORAL_CAPSULE | ORAL | 0 refills | Status: DC
Start: 2021-02-24 — End: 2021-03-16

## 2021-02-24 MED ORDER — SERTRALINE HCL 50 MG PO TABS: 50.0000 mg | ORAL_TABLET | Freq: Every day | ORAL | 0 refills | Status: DC

## 2021-02-25 ENCOUNTER — Other Ambulatory Visit (INDEPENDENT_AMBULATORY_CARE_PROVIDER_SITE_OTHER): Payer: Self-pay | Admitting: Family Medicine

## 2021-02-25 DIAGNOSIS — E559 Vitamin D deficiency, unspecified: Secondary | ICD-10-CM

## 2021-02-28 ENCOUNTER — Other Ambulatory Visit (INDEPENDENT_AMBULATORY_CARE_PROVIDER_SITE_OTHER): Payer: Self-pay | Admitting: Family Medicine

## 2021-02-28 DIAGNOSIS — R632 Polyphagia: Secondary | ICD-10-CM

## 2021-03-02 ENCOUNTER — Telehealth (INDEPENDENT_AMBULATORY_CARE_PROVIDER_SITE_OTHER): Payer: Self-pay

## 2021-03-02 NOTE — Progress Notes (Signed)
Chief Complaint:   OBESITY [name] is here to discuss her progress with her obesity treatment plan along with follow-up of her obesity related diagnoses. Loyal is on the Category 3 Plan and states she is following her eating plan approximately 85% of the time. [name] states she is swimming, walking, and indoor biking 45-60 minutes 4 times per week.  Today's visit was #: 4 Starting weight: 375 lbs Starting date: 03/17/2020 Today's weight: 363 lbs Today's date: 02/24/2021 Total lbs lost to date: 12 Total lbs lost since last in-office visit: 0  Interim History: [name] had a busy 4th of July. She has been increasing her exercise and doing swimming, walking, and biking. No real consistent hunger. She has eggs in the morning with milk or milk and cereal. Lunch is a sandwich and berries. Dinner is meat and vegetables (slitting it up into 2 meals). Snack calories include Skinny Pop, Skinny Cow Ice Cream sandwich, berries, and almonds. Sometimes getting in all 300 calories.  Subjective:   1. Vitamin D deficiency Pt denies nausea, vomiting, and muscle weakness but notes fatigue. Pt is on prescription Vit D. Her last Vit D level was 11.8.  Baker is on Ozempic with good control. She denies GI side effects.  3. Depression with anxiety Pt denies suicidal or homicidal ideations. Her symptoms are well controlled with Zoloft.  4. At risk for deficient intake of food [name] is at risk for deficient intake of food due to compared amount of physical activity.  Assessment/Plan:   1. Vitamin D deficiency Low Vitamin D level contributes to fatigue and are associated with obesity, breast, and colon cancer. She agrees to continue to take prescription Vitamin D @50 ,000 IU every week and will follow-up for routine testing of Vitamin D, at least 2-3 times per year to avoid over-replacement.  Refill- Vitamin D, Ergocalciferol, (DRISDOL) 1.25 MG (50000 UNIT) CAPS capsule; Take 1 capsule  (50,000 Units total) by mouth every 7 (seven) days.  Dispense: 4 capsule; Refill: 0  2. Polyphagia Intensive lifestyle modifications are the first line treatment for this issue. We discussed several lifestyle modifications today and she will continue to work on diet, exercise and weight loss efforts. Orders and follow up as documented in patient record.  Counseling Polyphagia is excessive hunger. Causes can include: low blood sugars, hypERthyroidism, PMS, lack of sleep, stress, insulin resistance, diabetes, certain medications, and diets that are deficient in protein and fiber.   Refill- Semaglutide,0.25 or 0.5MG /DOS, (OZEMPIC, 0.25 OR 0.5 MG/DOSE,) 2 MG/1.5ML SOPN; Inject 0.5 mg into the skin once a week.  Dispense: 1.5 mL; Refill: 0  3. Depression with anxiety Behavior modification techniques were discussed today to help [name] deal with her emotional/non-hunger eating behaviors.  Orders and follow up as documented in patient record.   Refill- sertraline (ZOLOFT) 50 MG tablet; Take 1 tablet (50 mg total) by mouth daily.  Dispense: 30 tablet; Refill: 0  4. At risk for deficient intake of food [name] was given approximately 15 minutes of deficit intake of food prevention counseling today. [name] is at risk for eating too few calories based on current food recall. She was encouraged to focus on meeting caloric and protein goals according to her recommended meal plan.    5. Class 3 severe obesity with serious comorbidity and body mass index (BMI) of 50.0 to 59.9 in adult, unspecified obesity type (HCC)  [name] is currently in the action stage of change. As such, her goal is to continue with  weight loss efforts. She has agreed to the Category 3 Plan.   Exercise goals: All adults should avoid inactivity. Some physical activity is better than none, and adults who participate in any amount of physical activity gain some health benefits.  Behavioral modification strategies: increasing lean protein  intake, meal planning and cooking strategies, and keeping healthy foods in the home.  [name] has agreed to follow-up with our clinic in 3 weeks- fasting and repeat IC. She was informed of the importance of frequent follow-up visits to maximize her success with intensive lifestyle modifications for her multiple health conditions.   Objective:   Blood pressure 129/84, pulse 77, temperature 98.1 F (36.7 C), height 5\' 7"  (1.702 m), weight (!) 363 lb (164.7 kg), SpO2 96 %. Body mass index is 56.85 kg/m.  General: Cooperative, alert, well developed, in no acute distress. HEENT: Conjunctivae and lids unremarkable. Cardiovascular: Regular rhythm.  Lungs: Normal work of breathing. Neurologic: No focal deficits.   Lab Results  Component Value Date   CREATININE 0.87 03/17/2020   BUN 12 03/17/2020   NA 138 03/17/2020   K 4.2 03/17/2020   CL 102 03/17/2020   CO2 21 03/17/2020   Lab Results  Component Value Date   ALT 13 03/17/2020   AST 19 03/17/2020   ALKPHOS 54 03/17/2020   BILITOT 0.3 03/17/2020   Lab Results  Component Value Date   HGBA1C 5.7 (H) 03/17/2020   Lab Results  Component Value Date   INSULIN 37.9 (H) 03/17/2020   No results found for: TSH No results found for: CHOL, HDL, LDLCALC, LDLDIRECT, TRIG, CHOLHDL Lab Results  Component Value Date   VD25OH 11.8 (L) 03/17/2020   No results found for: WBC, HGB, HCT, MCV, PLT No results found for: IRON, TIBC, FERRITIN  Attestation Statements:   Reviewed by clinician on day of visit: allergies, medications, problem list, medical history, surgical history, family history, social history, and previous encounter notes.  Coral Ceo, CMA, am acting as transcriptionist for Coralie Common, MD.  I have reviewed the above documentation for accuracy and completeness, and I agree with the above. - Coralie Common, MD

## 2021-03-02 NOTE — Telephone Encounter (Signed)
Pt called in asked what the status on the medication Ozempic. The pt is requesting a call back. Please advise

## 2021-03-02 NOTE — Telephone Encounter (Signed)
Last OV with Dr Ukleja 

## 2021-03-03 ENCOUNTER — Encounter (INDEPENDENT_AMBULATORY_CARE_PROVIDER_SITE_OTHER): Payer: Self-pay

## 2021-03-03 NOTE — Telephone Encounter (Signed)
I have not received a PA request for this patient. I will go ahead and start the PA now for this patient.

## 2021-03-03 NOTE — Telephone Encounter (Signed)
Last OV with Dr Ukleja 

## 2021-03-03 NOTE — Telephone Encounter (Signed)
[email or web address]

## 2021-03-16 ENCOUNTER — Other Ambulatory Visit: Payer: Self-pay

## 2021-03-16 ENCOUNTER — Encounter (INDEPENDENT_AMBULATORY_CARE_PROVIDER_SITE_OTHER): Payer: Self-pay | Admitting: Family Medicine

## 2021-03-16 ENCOUNTER — Ambulatory Visit (INDEPENDENT_AMBULATORY_CARE_PROVIDER_SITE_OTHER): Payer: BC Managed Care – PPO | Admitting: Family Medicine

## 2021-03-16 VITALS — BP 139/81 | HR 81 | Temp 98.1°F | Ht 67.0 in | Wt 364.0 lb

## 2021-03-16 DIAGNOSIS — Z6841 Body Mass Index (BMI) 40.0 and over, adult: Secondary | ICD-10-CM

## 2021-03-16 DIAGNOSIS — Z9189 Other specified personal risk factors, not elsewhere classified: Secondary | ICD-10-CM

## 2021-03-16 DIAGNOSIS — N946 Dysmenorrhea, unspecified: Secondary | ICD-10-CM

## 2021-03-16 DIAGNOSIS — E559 Vitamin D deficiency, unspecified: Secondary | ICD-10-CM | POA: Diagnosis not present

## 2021-03-16 DIAGNOSIS — R632 Polyphagia: Secondary | ICD-10-CM

## 2021-03-16 MED ORDER — OZEMPIC (0.25 OR 0.5 MG/DOSE) 2 MG/1.5ML ~~LOC~~ SOPN: 0.5000 mg | PEN_INJECTOR | SUBCUTANEOUS | 0 refills | Status: DC

## 2021-03-16 MED ORDER — NORETHINDRONE ACET-ETHINYL EST 1-20 MG-MCG PO TABS: 1.0000 | ORAL_TABLET | Freq: Every day | ORAL | 5 refills | Status: AC

## 2021-03-16 MED ORDER — VITAMIN D (ERGOCALCIFEROL) 1.25 MG (50000 UNIT) PO CAPS: 50000.0000 [IU] | ORAL_CAPSULE | ORAL | 0 refills | Status: DC

## 2021-03-17 NOTE — Progress Notes (Signed)
Chief Complaint:   OBESITY [name] is here to discuss her progress with her obesity treatment plan along with follow-up of her obesity related diagnoses. [name] is on the Category 3 Plan with intermittent fasting and states she is following her eating plan approximately 80% of the time. [name] states she is going to the gym 60 minutes 2 times per week.  Today's visit was #: 18 Starting weight: 375 lbs Starting date: 03/17/2020 Today's weight: 364 lbs Today's date: 03/16/2021 Total lbs lost to date: 11 Total lbs lost since last in-office visit: 0  Interim History: [name] is incorporating more strength training at the gym over the next few weeks. She is still waiting until she is hungry to eat and not just time to eat. She is still struggling with cravings, they are just not at night anymore. She starts eating around 11 AM wit protein shake then lunch later.  Subjective:   1. Vitamin D deficiency Danika denies nausea, vomiting, and muscle weakness but notes fatigue. She is on prescription Vit D. Her last Vit D level was 11.8.  [name] is doing better on Ozempic 0.5 mg. She had to go 2 weeks without it.  3. Dysmenorrhea [name] reports cramps during her menses. Symptoms improved with Loestrin.  4. At risk for deficient intake of food [name] is at risk for deficient intake of food due to only eating when she's hungry.  Assessment/Plan:   1. Vitamin D deficiency Low Vitamin D level contributes to fatigue and are associated with obesity, breast, and colon cancer. She agrees to continue to take prescription Vitamin D '@50'$ ,000 IU every week and will follow-up for routine testing of Vitamin D, at least 2-3 times per year to avoid over-replacement.  Refill- Vitamin D, Ergocalciferol, (DRISDOL) 1.25 MG (50000 UNIT) CAPS capsule; Take 1 capsule (50,000 Units total) by mouth every 7 (seven) days.  Dispense: 4 capsule; Refill: 0  2. Polyphagia Intensive lifestyle modifications  are the first line treatment for this issue. We discussed several lifestyle modifications today and she will continue to work on diet, exercise and weight loss efforts. Orders and follow up as documented in patient record.  Counseling Polyphagia is excessive hunger. Causes can include: low blood sugars, hypERthyroidism, PMS, lack of sleep, stress, insulin resistance, diabetes, certain medications, and diets that are deficient in protein and fiber.   Refill- Semaglutide,0.25 or 0.'5MG'$ /DOS, (OZEMPIC, 0.25 OR 0.5 MG/DOSE,) 2 MG/1.5ML SOPN; Inject 0.5 mg into the skin once a week.  Dispense: 1.5 mL; Refill: 0  3. Dysmenorrhea Continue Loestrin as directed.  Refill- norethindrone-ethinyl estradiol (LOESTRIN) 1-20 MG-MCG tablet; Take 1 tablet by mouth daily.  Dispense: 28 tablet; Refill: 5  4. At risk for deficient intake of food [name] was given approximately 15 minutes of deficit intake of food prevention counseling today. [name] is at risk for eating too few calories based on current food recall. She was encouraged to focus on meeting caloric and protein goals according to her recommended meal plan.    5. Obesity with current BMI of 57.1  [name] is currently in the action stage of change. As such, her goal is to continue with weight loss efforts. She has agreed to the Category 3 Plan.   Exercise goals:  As is  Behavioral modification strategies: increasing lean protein intake, meal planning and cooking strategies, keeping healthy foods in the home, and planning for success.  [name] has agreed to follow-up with our clinic in 2-3 weeks. She was informed of  the importance of frequent follow-up visits to maximize her success with intensive lifestyle modifications for her multiple health conditions.   Objective:   Blood pressure 139/81, pulse 81, temperature 98.1 F (36.7 C), height '5\' 7"'$  (1.702 m), weight (!) 364 lb (165.1 kg), SpO2 98 %. Body mass index is 57.01 kg/m.  General: Cooperative,  alert, well developed, in no acute distress. HEENT: Conjunctivae and lids unremarkable. Cardiovascular: Regular rhythm.  Lungs: Normal work of breathing. Neurologic: No focal deficits.   Lab Results  Component Value Date   CREATININE 0.87 03/17/2020   BUN 12 03/17/2020   NA 138 03/17/2020   K 4.2 03/17/2020   CL 102 03/17/2020   CO2 21 03/17/2020   Lab Results  Component Value Date   ALT 13 03/17/2020   AST 19 03/17/2020   ALKPHOS 54 03/17/2020   BILITOT 0.3 03/17/2020   Lab Results  Component Value Date   HGBA1C 5.7 (H) 03/17/2020   Lab Results  Component Value Date   INSULIN 37.9 (H) 03/17/2020   No results found for: TSH No results found for: CHOL, HDL, LDLCALC, LDLDIRECT, TRIG, CHOLHDL Lab Results  Component Value Date   VD25OH 11.8 (L) 03/17/2020    Attestation Statements:   Reviewed by clinician on day of visit: allergies, medications, problem list, medical history, surgical history, family history, social history, and previous encounter notes.  Coral Ceo, CMA, am acting as transcriptionist for Coralie Common, MD.   I have reviewed the above documentation for accuracy and completeness, and I agree with the above. - Coralie Common, MD

## 2021-03-26 ENCOUNTER — Other Ambulatory Visit (INDEPENDENT_AMBULATORY_CARE_PROVIDER_SITE_OTHER): Payer: Self-pay | Admitting: Family Medicine

## 2021-03-26 DIAGNOSIS — E559 Vitamin D deficiency, unspecified: Secondary | ICD-10-CM

## 2021-04-01 ENCOUNTER — Ambulatory Visit (INDEPENDENT_AMBULATORY_CARE_PROVIDER_SITE_OTHER): Payer: BC Managed Care – PPO | Admitting: Family Medicine

## 2021-04-07 ENCOUNTER — Other Ambulatory Visit (INDEPENDENT_AMBULATORY_CARE_PROVIDER_SITE_OTHER): Payer: Self-pay | Admitting: Family Medicine

## 2021-04-07 DIAGNOSIS — F418 Other specified anxiety disorders: Secondary | ICD-10-CM

## 2021-04-25 ENCOUNTER — Other Ambulatory Visit (INDEPENDENT_AMBULATORY_CARE_PROVIDER_SITE_OTHER): Payer: Self-pay | Admitting: Family Medicine

## 2021-04-25 DIAGNOSIS — E559 Vitamin D deficiency, unspecified: Secondary | ICD-10-CM

## 2021-05-03 ENCOUNTER — Other Ambulatory Visit (INDEPENDENT_AMBULATORY_CARE_PROVIDER_SITE_OTHER): Payer: Self-pay | Admitting: Family Medicine

## 2021-05-03 DIAGNOSIS — R632 Polyphagia: Secondary | ICD-10-CM

## 2021-08-20 ENCOUNTER — Ambulatory Visit (INDEPENDENT_AMBULATORY_CARE_PROVIDER_SITE_OTHER): Payer: BC Managed Care – PPO | Admitting: Family Medicine

## 2021-08-20 ENCOUNTER — Other Ambulatory Visit: Payer: Self-pay

## 2021-08-20 ENCOUNTER — Encounter (INDEPENDENT_AMBULATORY_CARE_PROVIDER_SITE_OTHER): Payer: Self-pay | Admitting: Family Medicine

## 2021-08-20 VITALS — BP 138/87 | HR 82 | Temp 98.3°F | Ht 67.0 in | Wt 367.0 lb

## 2021-08-20 DIAGNOSIS — E559 Vitamin D deficiency, unspecified: Secondary | ICD-10-CM

## 2021-08-20 DIAGNOSIS — Z6841 Body Mass Index (BMI) 40.0 and over, adult: Secondary | ICD-10-CM

## 2021-08-20 DIAGNOSIS — R632 Polyphagia: Secondary | ICD-10-CM | POA: Diagnosis not present

## 2021-08-20 DIAGNOSIS — M25571 Pain in right ankle and joints of right foot: Secondary | ICD-10-CM | POA: Diagnosis not present

## 2021-08-20 DIAGNOSIS — E66813 Obesity, class 3: Secondary | ICD-10-CM

## 2021-08-20 MED ORDER — OZEMPIC (0.25 OR 0.5 MG/DOSE) 2 MG/1.5ML ~~LOC~~ SOPN: 0.2500 mg | PEN_INJECTOR | SUBCUTANEOUS | 0 refills | Status: DC

## 2021-08-20 MED ORDER — VITAMIN D (ERGOCALCIFEROL) 1.25 MG (50000 UNIT) PO CAPS: 50000.0000 [IU] | ORAL_CAPSULE | ORAL | 0 refills | Status: DC

## 2021-08-24 ENCOUNTER — Other Ambulatory Visit (INDEPENDENT_AMBULATORY_CARE_PROVIDER_SITE_OTHER): Payer: Self-pay | Admitting: Family Medicine

## 2021-08-24 DIAGNOSIS — R632 Polyphagia: Secondary | ICD-10-CM

## 2021-08-24 NOTE — Progress Notes (Signed)
Chief Complaint:   OBESITY [name] is here to discuss her progress with her obesity treatment plan along with follow-up of her obesity related diagnoses. [name] is on the Category 3 Plan and states she is following her eating plan approximately 80% of the time. [name] states she is working out on [employer] at home 45-60 minutes 3 times per week.  Today's visit was #: 63 Starting weight: 375 lbs Starting date: 03/17/2020 Today's weight: 367 lbs Today's date: 08/20/2021 Total lbs lost to date: 8 Total lbs lost since last in-office visit: 0  Interim History: [name] had a difficult holiday season- stayed home for the holidays. She is not going to the gym anymore- mostly working out at home. Pt wants to get back on track and has restarted category 3 plan. Snacking is still an issue so she has stopped sitting in her bed and snacking. Pt has been doing strength training more regularly.  Subjective:   1. Right ankle pain, unspecified chronicity Pt reports tenderness on navicular bone. She suffered an injury about 6 weeks ago and has not had an x-ray.  2. Polyphagia [name] hasn't been on Ozempic in a while. She denies GI side effects when she is taking it.  3. Vitamin D deficiency Pt denies nausea, vomiting, and muscle weakness but notes fatigue. Her last Vit D level was 11.8.  Assessment/Plan:   1. Right ankle pain, unspecified chronicity Pt is to be evaluated by ortho to assess is she needs further intervention.  2. Polyphagia Intensive lifestyle modifications are the first line treatment for this issue. We discussed several lifestyle modifications today and she will continue to work on diet, exercise and weight loss efforts. Orders and follow up as documented in patient record. Decrease Ozempic to 0.25 mg weekly.  Counseling Polyphagia is excessive hunger. Causes can include: low blood sugars, hypERthyroidism, PMS, lack of sleep, stress, insulin resistance, diabetes, certain  medications, and diets that are deficient in protein and fiber.   Decrease & Refill- Semaglutide,0.25 or 0.5MG /DOS, (OZEMPIC, 0.25 OR 0.5 MG/DOSE,) 2 MG/1.5ML SOPN; Inject 0.25 mg into the skin once a week.  Dispense: 1.5 mL; Refill: 0  3. Vitamin D deficiency Low Vitamin D level contributes to fatigue and are associated with obesity, breast, and colon cancer. She agrees to continue to take prescription Vitamin D 50,000 IU every week and will follow-up for routine testing of Vitamin D, at least 2-3 times per year to avoid over-replacement.  Refill- Vitamin D, Ergocalciferol, (DRISDOL) 1.25 MG (50000 UNIT) CAPS capsule; Take 1 capsule (50,000 Units total) by mouth every 7 (seven) days.  Dispense: 4 capsule; Refill: 0  4. Obesity with current BMI of 57.5  [name] is currently in the action stage of change. As such, her goal is to continue with weight loss efforts. She has agreed to the Category 3 Plan.   Exercise goals:  As is  Behavioral modification strategies: increasing lean protein intake, decreasing simple carbohydrates, meal planning and cooking strategies, better snacking choices, and planning for success.  [name] has agreed to follow-up with our clinic in 3 weeks. She was informed of the importance of frequent follow-up visits to maximize her success with intensive lifestyle modifications for her multiple health conditions.   Objective:   Blood pressure 138/87, pulse 82, temperature 98.3 F (36.8 C), height 5\' 7"  (1.702 m), weight (!) 367 lb (166.5 kg), last menstrual period 08/16/2021, SpO2 98 %. Body mass index is 57.48 kg/m.  General: Cooperative, alert, well developed,  in no acute distress. HEENT: Conjunctivae and lids unremarkable. Cardiovascular: Regular rhythm.  Lungs: Normal work of breathing. Neurologic: No focal deficits.   Lab Results  Component Value Date   CREATININE 0.87 03/17/2020   BUN 12 03/17/2020   NA 138 03/17/2020   K 4.2 03/17/2020   CL 102 03/17/2020    CO2 21 03/17/2020   Lab Results  Component Value Date   ALT 13 03/17/2020   AST 19 03/17/2020   ALKPHOS 54 03/17/2020   BILITOT 0.3 03/17/2020   Lab Results  Component Value Date   HGBA1C 5.7 (H) 03/17/2020   Lab Results  Component Value Date   INSULIN 37.9 (H) 03/17/2020   No results found for: TSH No results found for: CHOL, HDL, LDLCALC, LDLDIRECT, TRIG, CHOLHDL Lab Results  Component Value Date   VD25OH 11.8 (L) 03/17/2020    Attestation Statements:   Reviewed by clinician on day of visit: allergies, medications, problem list, medical history, surgical history, family history, social history, and previous encounter notes.  Coral Ceo, CMA, am acting as transcriptionist for Coralie Common, MD.  I have reviewed the above documentation for accuracy and completeness, and I agree with the above. - Coralie Common, MD

## 2021-09-07 ENCOUNTER — Other Ambulatory Visit: Payer: Self-pay

## 2021-09-07 ENCOUNTER — Ambulatory Visit (INDEPENDENT_AMBULATORY_CARE_PROVIDER_SITE_OTHER): Payer: BC Managed Care – PPO | Admitting: Family Medicine

## 2021-09-07 ENCOUNTER — Encounter (INDEPENDENT_AMBULATORY_CARE_PROVIDER_SITE_OTHER): Payer: Self-pay | Admitting: Family Medicine

## 2021-09-07 VITALS — BP 143/86 | HR 82 | Temp 98.3°F | Ht 67.0 in | Wt 367.0 lb

## 2021-09-07 DIAGNOSIS — E559 Vitamin D deficiency, unspecified: Secondary | ICD-10-CM

## 2021-09-07 DIAGNOSIS — E669 Obesity, unspecified: Secondary | ICD-10-CM | POA: Diagnosis not present

## 2021-09-07 DIAGNOSIS — Z6841 Body Mass Index (BMI) 40.0 and over, adult: Secondary | ICD-10-CM

## 2021-09-07 DIAGNOSIS — R632 Polyphagia: Secondary | ICD-10-CM | POA: Diagnosis not present

## 2021-09-07 MED ORDER — OZEMPIC (0.25 OR 0.5 MG/DOSE) 2 MG/1.5ML ~~LOC~~ SOPN: 0.2500 mg | PEN_INJECTOR | SUBCUTANEOUS | 0 refills | Status: DC

## 2021-09-07 MED ORDER — VITAMIN D (ERGOCALCIFEROL) 1.25 MG (50000 UNIT) PO CAPS: 50000.0000 [IU] | ORAL_CAPSULE | ORAL | 0 refills | Status: DC

## 2021-09-07 NOTE — Progress Notes (Signed)
Chief Complaint:   OBESITY [name] is here to discuss her progress with her obesity treatment plan along with follow-up of her obesity related diagnoses. [name] is on the Category 3 Plan and states she is following her eating plan approximately 90% of the time. [name] states she is working out 45-60 minutes 3 times per week.  Today's visit was #: 20 Starting weight: 375 lbs Starting date: 03/17/2020 Today's weight: 367 lbs Today's date: 09/07/2021 Total lbs lost to date: 8 Total lbs lost since last in-office visit: 0  Interim History: Pt has finally cut soda out completely, stopped snacking at night, and cut cereal out. She is feeling better with more energy. She is getting most of food in throughout the day. Pt thinks she is getting all food in, as she is weighing it. She is doing 8 oz protein and 2 cups of vegetables for dinner. Pt is doing protein bar and vegetables for snack calories. Lunch is microwave meal and fruits and vegetables.  Subjective:   1. Polyphagia Ozempic is helping, especially with nighttime eating with no GI side effects.  2. Vitamin D deficiency Pt denies nausea, vomiting, and muscle weakness but notes fatigue. She is on prescription Vit D.  Assessment/Plan:   1. Polyphagia Intensive lifestyle modifications are the first line treatment for this issue. We discussed several lifestyle modifications today and she will continue to work on diet, exercise and weight loss efforts. Orders and follow up as documented in patient record.  Counseling Polyphagia is excessive hunger. Causes can include: low blood sugars, hypERthyroidism, PMS, lack of sleep, stress, insulin resistance, diabetes, certain medications, and diets that are deficient in protein and fiber.   Refill- Semaglutide,0.25 or 0.5MG /DOS, (OZEMPIC, 0.25 OR 0.5 MG/DOSE,) 2 MG/1.5ML SOPN; Inject 0.25 mg into the skin once a week.  Dispense: 1.5 mL; Refill: 0  2. Vitamin D deficiency Low Vitamin D level  contributes to fatigue and are associated with obesity, breast, and colon cancer. She agrees to continue to take prescription Vitamin D 50,000 IU every week and will follow-up for routine testing of Vitamin D, at least 2-3 times per year to avoid over-replacement.  Refill- Vitamin D, Ergocalciferol, (DRISDOL) 1.25 MG (50000 UNIT) CAPS capsule; Take 1 capsule (50,000 Units total) by mouth every 7 (seven) days.  Dispense: 4 capsule; Refill: 0  3. Obesity with current BMI of 57.6  [name] is currently in the action stage of change. As such, her goal is to continue with weight loss efforts. She has agreed to the Category 3 Plan.   Exercise goals:  As is  Behavioral modification strategies: increasing lean protein intake, meal planning and cooking strategies, keeping healthy foods in the home, and planning for success.  [name] has agreed to follow-up with our clinic in 3 weeks. She was informed of the importance of frequent follow-up visits to maximize her success with intensive lifestyle modifications for her multiple health conditions.   Objective:   Blood pressure (!) 143/86, pulse 82, temperature 98.3 F (36.8 C), height 5\' 7"  (1.702 m), weight (!) 367 lb (166.5 kg), last menstrual period 08/16/2021, SpO2 97 %. Body mass index is 57.48 kg/m.  General: Cooperative, alert, well developed, in no acute distress. HEENT: Conjunctivae and lids unremarkable. Cardiovascular: Regular rhythm.  Lungs: Normal work of breathing. Neurologic: No focal deficits.   Lab Results  Component Value Date   CREATININE 0.87 03/17/2020   BUN 12 03/17/2020   NA 138 03/17/2020   K 4.2 03/17/2020  CL 102 03/17/2020   CO2 21 03/17/2020   Lab Results  Component Value Date   ALT 13 03/17/2020   AST 19 03/17/2020   ALKPHOS 54 03/17/2020   BILITOT 0.3 03/17/2020   Lab Results  Component Value Date   HGBA1C 5.7 (H) 03/17/2020   Lab Results  Component Value Date   INSULIN 37.9 (H) 03/17/2020   No  results found for: TSH No results found for: CHOL, HDL, LDLCALC, LDLDIRECT, TRIG, CHOLHDL Lab Results  Component Value Date   VD25OH 11.8 (L) 03/17/2020   No results found for: WBC, HGB, HCT, MCV, PLT No results found for: IRON, TIBC, FERRITIN Attestation Statements:   Reviewed by clinician on day of visit: allergies, medications, problem list, medical history, surgical history, family history, social history, and previous encounter notes.  Coral Ceo, CMA, am acting as transcriptionist for Coralie Common, MD.   I have reviewed the above documentation for accuracy and completeness, and I agree with the above. - Coralie Common, MD

## 2021-10-05 ENCOUNTER — Encounter (INDEPENDENT_AMBULATORY_CARE_PROVIDER_SITE_OTHER): Payer: Self-pay | Admitting: Physician Assistant

## 2021-10-05 ENCOUNTER — Other Ambulatory Visit: Payer: Self-pay

## 2021-10-05 ENCOUNTER — Ambulatory Visit (INDEPENDENT_AMBULATORY_CARE_PROVIDER_SITE_OTHER): Payer: BC Managed Care – PPO | Admitting: Physician Assistant

## 2021-10-05 VITALS — BP 134/76 | HR 82 | Temp 98.3°F | Ht 67.0 in | Wt 362.0 lb

## 2021-10-05 DIAGNOSIS — Z6841 Body Mass Index (BMI) 40.0 and over, adult: Secondary | ICD-10-CM | POA: Diagnosis not present

## 2021-10-05 DIAGNOSIS — R632 Polyphagia: Secondary | ICD-10-CM | POA: Diagnosis not present

## 2021-10-05 DIAGNOSIS — E669 Obesity, unspecified: Secondary | ICD-10-CM

## 2021-10-05 DIAGNOSIS — Z9189 Other specified personal risk factors, not elsewhere classified: Secondary | ICD-10-CM

## 2021-10-05 DIAGNOSIS — E559 Vitamin D deficiency, unspecified: Secondary | ICD-10-CM | POA: Diagnosis not present

## 2021-10-05 MED ORDER — VITAMIN D (ERGOCALCIFEROL) 1.25 MG (50000 UNIT) PO CAPS: 50000.0000 [IU] | ORAL_CAPSULE | ORAL | 0 refills | Status: DC

## 2021-10-05 MED ORDER — OZEMPIC (0.25 OR 0.5 MG/DOSE) 2 MG/1.5ML ~~LOC~~ SOPN: 0.5000 mg | PEN_INJECTOR | SUBCUTANEOUS | 0 refills | Status: DC

## 2021-10-05 NOTE — Progress Notes (Signed)
Chief Complaint:   OBESITY [name] is here to discuss her progress with her obesity treatment plan along with follow-up of her obesity related diagnoses. [name] is on the Category 3 Plan and states she is following her eating plan approximately 85% of the time. [name] states she is not exercising regularly due to a knee injury 2.5 weeks ago.  Today's visit was #: 21 Starting weight: 375 lbs Starting date: 03/17/2020 Today's weight: 362 lbs Today's date: 10/05/2021 Total lbs lost to date: 13 lbs Total lbs lost since last in-office visit: 5 lbs  Interim History: [name] is making sure that the foods that she needs to make breakfast and lunch are in the house.  Her cravings for sweets have decreased.  When she is done with dinner, she stops eating for the rest of the night.  Subjective:   1. Vitamin D deficiency [name] is on vitamin D.  No nausea, vomiting, muscle weakness.  2. Polyphagia She is on Ozempic 0.25 mg.  No side effects.  Denies excessive hunger.  3. At risk for osteoporosis [name] is at higher risk of osteopenia and osteoporosis due to Vitamin D deficiency.   Assessment/Plan:   1. Vitamin D deficiency Low Vitamin D level contributes to fatigue and are associated with obesity, breast, and colon cancer. She agrees to continue to take prescription Vitamin D @50 ,000 IU every week and will follow-up for routine testing of Vitamin D, at least 2-3 times per year to avoid over-replacement.  - Refill Vitamin D, Ergocalciferol, (DRISDOL) 1.25 MG (50000 UNIT) CAPS capsule; Take 1 capsule (50,000 Units total) by mouth every 7 (seven) days.  Dispense: 4 capsule; Refill: 0  2. Polyphagia [name] will increase her Ozempic dose to 0.5 mg once weekly.  Intensive lifestyle modifications are the first line treatment for this issue. We discussed several lifestyle modifications today and she will continue to work on diet, exercise and weight loss efforts. Orders and follow up as documented  in patient record.  Counseling Polyphagia is excessive hunger. Causes can include: low blood sugars, hypERthyroidism, PMS, lack of sleep, stress, insulin resistance, diabetes, certain medications, and diets that are deficient in protein and fiber.   - Increase Semaglutide,0.25 or 0.5MG /DOS, (OZEMPIC, 0.25 OR 0.5 MG/DOSE,) 2 MG/1.5ML SOPN; Inject 0.5 mg into the skin once a week.  Dispense: 3 mL; Refill: 0  3. At risk for osteoporosis [name] was given approximately 15 minutes of osteoporosis prevention counseling today. [name] is at risk for osteopenia and osteoporosis due to her Vitamin D deficiency. She was encouraged to take her Vit D and follow her higher calcium diet and increase strengthening exercise to help strengthen her bones and decrease her risk of osteopenia and osteoporosis.  4. Obesity with current BMI of 56.68  [name] is currently in the action stage of change. As such, her goal is to continue with weight loss efforts. She has agreed to the Category 3 Plan.   Exercise goals: No exercise has been prescribed at this time.  Behavioral modification strategies: meal planning and cooking strategies and travel eating strategies.  [name] has agreed to follow-up with our clinic in 3 weeks. She was informed of the importance of frequent follow-up visits to maximize her success with intensive lifestyle modifications for her multiple health conditions.   Objective:   Blood pressure 134/76, pulse 82, temperature 98.3 F (36.8 C), height 5\' 7"  (1.702 m), weight (!) 362 lb (164.2 kg), SpO2 97 %. Body mass index is 56.7 kg/m.  General:  Cooperative, alert, well developed, in no acute distress. HEENT: Conjunctivae and lids unremarkable. Cardiovascular: Regular rhythm.  Lungs: Normal work of breathing. Neurologic: No focal deficits.   Lab Results  Component Value Date   CREATININE 0.87 03/17/2020   BUN 12 03/17/2020   NA 138 03/17/2020   K 4.2 03/17/2020   CL 102 03/17/2020   CO2  21 03/17/2020   Lab Results  Component Value Date   ALT 13 03/17/2020   AST 19 03/17/2020   ALKPHOS 54 03/17/2020   BILITOT 0.3 03/17/2020   Lab Results  Component Value Date   HGBA1C 5.7 (H) 03/17/2020   Lab Results  Component Value Date   INSULIN 37.9 (H) 03/17/2020   Lab Results  Component Value Date   VD25OH 11.8 (L) 03/17/2020   Attestation Statements:   Reviewed by clinician on day of visit: allergies, medications, problem list, medical history, surgical history, family history, social history, and previous encounter notes.  I, Water quality scientist, CMA, am acting as transcriptionist for Abby Potash, PA-C  I have reviewed the above documentation for accuracy and completeness, and I agree with the above. Abby Potash, PA-C

## 2021-10-29 ENCOUNTER — Other Ambulatory Visit (INDEPENDENT_AMBULATORY_CARE_PROVIDER_SITE_OTHER): Payer: Self-pay | Admitting: Physician Assistant

## 2021-10-29 DIAGNOSIS — E559 Vitamin D deficiency, unspecified: Secondary | ICD-10-CM

## 2021-11-02 ENCOUNTER — Ambulatory Visit (INDEPENDENT_AMBULATORY_CARE_PROVIDER_SITE_OTHER): Payer: BC Managed Care – PPO | Admitting: Family Medicine

## 2021-11-03 ENCOUNTER — Other Ambulatory Visit: Payer: Self-pay

## 2021-11-03 ENCOUNTER — Encounter (INDEPENDENT_AMBULATORY_CARE_PROVIDER_SITE_OTHER): Payer: Self-pay | Admitting: Family Medicine

## 2021-11-03 ENCOUNTER — Ambulatory Visit (INDEPENDENT_AMBULATORY_CARE_PROVIDER_SITE_OTHER): Payer: BC Managed Care – PPO | Admitting: Family Medicine

## 2021-11-03 VITALS — BP 138/87 | HR 82 | Temp 98.1°F | Ht 67.0 in | Wt 361.0 lb

## 2021-11-03 DIAGNOSIS — E559 Vitamin D deficiency, unspecified: Secondary | ICD-10-CM

## 2021-11-03 DIAGNOSIS — E669 Obesity, unspecified: Secondary | ICD-10-CM | POA: Diagnosis not present

## 2021-11-03 DIAGNOSIS — F418 Other specified anxiety disorders: Secondary | ICD-10-CM

## 2021-11-03 DIAGNOSIS — R632 Polyphagia: Secondary | ICD-10-CM | POA: Diagnosis not present

## 2021-11-03 DIAGNOSIS — Z6841 Body Mass Index (BMI) 40.0 and over, adult: Secondary | ICD-10-CM

## 2021-11-03 MED ORDER — SERTRALINE HCL 50 MG PO TABS: 50.0000 mg | ORAL_TABLET | Freq: Every day | ORAL | 0 refills | Status: DC

## 2021-11-03 MED ORDER — OZEMPIC (0.25 OR 0.5 MG/DOSE) 2 MG/1.5ML ~~LOC~~ SOPN: 0.5000 mg | PEN_INJECTOR | SUBCUTANEOUS | 0 refills | Status: DC

## 2021-11-04 NOTE — Progress Notes (Signed)
? ? ? ?Chief Complaint:  ? ?OBESITY ?[name] is here to discuss her progress with her obesity treatment plan along with follow-up of her obesity related diagnoses. [name] is on the Category 3 Plan and states she is following her eating plan approximately 85% of the time. [name] states she is going to the gym and walking 45 to 60 minutes for 2 to 3 times per week. ? ?Today's visit was #: 10 ?Starting weight: 375 lbs ?Starting date: 03/17/20 ?Today's weight: 361 lbs ?Today's date: 11/03/2021 ?Total lbs lost to date: 2 ?Total lbs lost since last in-office visit: 1 ? ?Interim History: [name] is working on water intake in the last few weeks. She is doing well on the meal plan. [name] is not doing any late night snacking or soda. Her knee is improved and she is finally capable of walking without a knee brace. [name] just found out that her insurance is no longer covering her plan appointment completely. She is trying to not get bored with options on the plan. [name] is able to preemptively anticipate some of her obstacles. ? ?Subjective:  ? ?1. Vitamin D deficiency ?She is currently taking prescription vitamin D 50,000 IU each week. Nylan has not had a vitamin D level since 2021. ?She notes fatigue and denies nausea, vomiting or muscle weakness. ? ?Lab Results  ?Component Value Date  ? VD25OH 11.8 (L) 03/17/2020  ? ?2. Polyphagia ?[name] is not sure if she's feeling any changes on Ozempic 0.'5mg'$ . She denies GI side effects.[name] endorses excessive hunger.   ? ?3. Depression with anxiety ?Trachelle is noticing an increase in some symptoms of anxiousness, insomnia, and some racing thoughts. She denies suicidal or homicidal ideations. ? ?Assessment/Plan:  ? ?1. Vitamin D deficiency ?Low Vitamin D level contributes to fatigue and are associated with obesity, breast, and colon cancer. She agrees to hold on vitamin d replacement until her vitamin D level is checked and will follow-up for routine testing of Vitamin D, at least 2-3  times per year to avoid over-replacement. ? ?2. Polyphagia ?Intensive lifestyle modifications are the first line treatment for this issue. We discussed several lifestyle modifications today and she will continue to work on diet, exercise and weight loss efforts. Orders and follow up as documented in patient record. ? ?Counseling ?Polyphagia is excessive hunger. ?Causes can include: low blood sugars, hyperthyroidism, PMS, lack of sleep, stress, insulin resistance, diabetes, certain medications, and diets that are deficient in protein and fiber.   ? ?- Semaglutide,0.25 or 0.'5MG'$ /DOS, (OZEMPIC, 0.25 OR 0.5 MG/DOSE,) 2 MG/1.5ML SOPN; Inject 0.5 mg into the skin once a week.  Dispense: 3 mL; Refill: 0 ? ?3. Depression with anxiety ?Behavior modification techniques were discussed today to help [name] deal with her emotional/non-hunger eating behaviors.  Orders and follow up as documented in patient record.  ? ?- sertraline (ZOLOFT) 50 MG tablet; Take 1 tablet (50 mg total) by mouth daily.  Dispense: 90 tablet; Refill: 0 ? ?4. Obesity with current BMI of 56.6 ? ?[name] is currently in the action stage of change. As such, her goal is to continue with weight loss efforts. She has agreed to the Category 3 Plan.  ? ?Exercise goals: All adults should avoid inactivity. Some physical activity is better than none, and adults who participate in any amount of physical activity gain some health benefits. ? ?Behavioral modification strategies: increasing lean protein intake, meal planning and cooking strategies, keeping healthy foods in the home, and planning for success. ? ?[name] has agreed  to follow-up with our clinic in 8 weeks. She was informed of the importance of frequent follow-up visits to maximize her success with intensive lifestyle modifications for her multiple health conditions.  ? ?Objective:  ? ?Blood pressure 138/87, pulse 82, temperature 98.1 ?F (36.7 ?C), height '5\' 7"'$  (1.702 m), weight (!) 361 lb (163.7 kg), last  menstrual period 10/14/2021, SpO2 98 %. ?Body mass index is 56.54 kg/m?. ? ?General: Cooperative, alert, well developed, in no acute distress. ?HEENT: Conjunctivae and lids unremarkable. ?Cardiovascular: Regular rhythm.  ?Lungs: Normal work of breathing. ?Neurologic: No focal deficits.  ? ?Lab Results  ?Component Value Date  ? CREATININE 0.87 03/17/2020  ? BUN 12 03/17/2020  ? NA 138 03/17/2020  ? K 4.2 03/17/2020  ? CL 102 03/17/2020  ? CO2 21 03/17/2020  ? ?Lab Results  ?Component Value Date  ? ALT 13 03/17/2020  ? AST 19 03/17/2020  ? ALKPHOS 54 03/17/2020  ? BILITOT 0.3 03/17/2020  ? ?Lab Results  ?Component Value Date  ? HGBA1C 5.7 (H) 03/17/2020  ? ?Lab Results  ?Component Value Date  ? INSULIN 37.9 (H) 03/17/2020  ? ?No results found for: TSH ?No results found for: CHOL, HDL, LDLCALC, LDLDIRECT, TRIG, CHOLHDL ?Lab Results  ?Component Value Date  ? VD25OH 11.8 (L) 03/17/2020  ? ?No results found for: WBC, HGB, HCT, MCV, PLT ?No results found for: IRON, TIBC, FERRITIN ? ? ?Attestation Statements:  ? ?Reviewed by clinician on day of visit: allergies, medications, problem list, medical history, surgical history, family history, social history, and previous encounter notes. ? ?I, Marcille Blanco, CMA, am acting as transcriptionist for Coralie Common, MD ?I have reviewed the above documentation for accuracy and completeness, and I agree with the above. Coralie Common, MD ? ?

## 2021-12-17 ENCOUNTER — Telehealth (INDEPENDENT_AMBULATORY_CARE_PROVIDER_SITE_OTHER): Payer: Self-pay | Admitting: Family Medicine

## 2021-12-17 ENCOUNTER — Other Ambulatory Visit (INDEPENDENT_AMBULATORY_CARE_PROVIDER_SITE_OTHER): Payer: Self-pay | Admitting: Family Medicine

## 2021-12-17 DIAGNOSIS — R632 Polyphagia: Secondary | ICD-10-CM

## 2021-12-17 MED ORDER — OZEMPIC (0.25 OR 0.5 MG/DOSE) 2 MG/1.5ML ~~LOC~~ SOPN: 0.5000 mg | PEN_INJECTOR | SUBCUTANEOUS | 0 refills | Status: DC

## 2021-12-17 NOTE — Telephone Encounter (Signed)
Refill for Ozempic. Next appt 5/16. ?

## 2021-12-17 NOTE — Telephone Encounter (Signed)
Ok to refill Ozempic ? ?LAST APPOINTMENT DATE: 11/03/21 ?NEXT APPOINTMENT DATE: 12/29/2021 ? ? ?[facility] [phone] - [place], [address] ?[place] [address] ?Phone: [phone] Fax: [phone] ? ?Patient is requesting a refill of the following medications: ?No prescriptions requested or ordered in this encounter ? ? ?Date last filled: 11/03/21 ?Previously prescribed by Dr Jearld Shines ? ?Lab Results ?     Component                Value               Date                 ?     HGBA1C                   5.7 (H)             03/17/2020           ?Lab Results ?     Component                Value               Date                 ?     CREATININE               0.87                03/17/2020           ?Lab Results ?     Component                Value               Date                 ?     VD25OH                   11.8 (L)            03/17/2020           ? ?BP Readings from Last 3 Encounters: ?11/03/21 : 138/87 ?10/05/21 : 134/76 ?09/07/21 : Marland Kitchen 143/86 ?

## 2021-12-17 NOTE — Telephone Encounter (Signed)
Medication sent to the pharmacy[facility]

## 2021-12-17 NOTE — Telephone Encounter (Signed)
RX Request, pt message already sent to you

## 2021-12-23 ENCOUNTER — Other Ambulatory Visit (INDEPENDENT_AMBULATORY_CARE_PROVIDER_SITE_OTHER): Payer: Self-pay | Admitting: Family Medicine

## 2021-12-23 DIAGNOSIS — N946 Dysmenorrhea, unspecified: Secondary | ICD-10-CM

## 2021-12-29 ENCOUNTER — Ambulatory Visit (INDEPENDENT_AMBULATORY_CARE_PROVIDER_SITE_OTHER): Payer: BC Managed Care – PPO | Admitting: Family Medicine

## 2021-12-29 ENCOUNTER — Encounter (INDEPENDENT_AMBULATORY_CARE_PROVIDER_SITE_OTHER): Payer: Self-pay | Admitting: Family Medicine

## 2021-12-29 VITALS — BP 139/89 | HR 81 | Temp 98.2°F | Ht 67.0 in | Wt 358.0 lb

## 2021-12-29 DIAGNOSIS — F418 Other specified anxiety disorders: Secondary | ICD-10-CM | POA: Diagnosis not present

## 2021-12-29 DIAGNOSIS — E669 Obesity, unspecified: Secondary | ICD-10-CM

## 2021-12-29 DIAGNOSIS — R632 Polyphagia: Secondary | ICD-10-CM

## 2021-12-29 DIAGNOSIS — Z6841 Body Mass Index (BMI) 40.0 and over, adult: Secondary | ICD-10-CM

## 2021-12-29 MED ORDER — SERTRALINE HCL 50 MG PO TABS: 50.0000 mg | ORAL_TABLET | Freq: Every day | ORAL | 0 refills | Status: DC

## 2021-12-29 MED ORDER — OZEMPIC (0.25 OR 0.5 MG/DOSE) 2 MG/1.5ML ~~LOC~~ SOPN: 0.5000 mg | PEN_INJECTOR | SUBCUTANEOUS | 0 refills | Status: DC

## 2022-01-04 NOTE — Progress Notes (Signed)
Chief Complaint:   OBESITY [name] is here to discuss her progress with her obesity treatment plan along with follow-up of her obesity related diagnoses. [name] is on the Category 3 Plan and states she is following her eating plan approximately 85% of the time. [name] states she is doing a workout for 40 minutes 3-4 times per week.  Today's visit was #: 23 Starting weight: 375 lbs Starting date: 03/17/2020 Today's weight: 358 lbs Today's date: 12/29/2021 Total lbs lost to date: 17 lbs Total lbs lost since last in-office visit: 3 lbs  Interim History: [name] has been trying to maintain at Category 3 and getting home workout in. She has been more hungry recently; especially before breakfast. She is getting 8 ounces of protein at supper. She is snacking on pickles, bananas, and peanut butter. Her son is preparing to switch schools to [employer]. She is chaining a Engineering geologist style event. She feels better with exercise.   Subjective:   1. Polyphagia [name] is doing well on 0.5 mg Ozempic. She notes some hunger in the morning.   2. Depression with anxiety [name] is doing very well on Zoloft. She denies suicidal ideations and homicidal ideations.   Assessment/Plan:   1. Polyphagia Intensive lifestyle modifications are the first line treatment for this issue. We will refill Ozempic 0.5 mg subcutaneous weekly for 1 month with no refills. We discussed several lifestyle modifications today and she will continue to work on diet, exercise and weight loss efforts. Orders and follow up as documented in patient record.  Counseling Polyphagia is excessive hunger. Causes can include: low blood sugars, hypERthyroidism, PMS, lack of sleep, stress, insulin resistance, diabetes, certain medications, and diets that are deficient in protein and fiber.    - Semaglutide,0.25 or 0.'5MG'$ /DOS, (OZEMPIC, 0.25 OR 0.5 MG/DOSE,) 2 MG/1.5ML SOPN; Inject 0.5 mg into the skin once a  week.  Dispense: 3 mL; Refill: 0  2. Depression with anxiety Behavior modification techniques were discussed today to help Alaila deal with her emotional/non-hunger eating behaviors.  We will refill Sertraline 50 mg by mouth for 3 months with no refills. Orders and follow up as documented in patient record.   - sertraline (ZOLOFT) 50 MG tablet; Take 1 tablet (50 mg total) by mouth daily.  Dispense: 90 tablet; Refill: 0  3. Obesity with current BMI of 56.1 [name] is currently in the action stage of change. As such, her goal is to continue with weight loss efforts. She has agreed to the Category 3 Plan.   Exercise goals: All adults should avoid inactivity. Some physical activity is better than none, and adults who participate in any amount of physical activity gain some health benefits.  Behavioral modification strategies: increasing lean protein intake, meal planning and cooking strategies, keeping healthy foods in the home, and planning for success.  [name] has agreed to follow-up with our clinic in 6-8 weeks. She was informed of the importance of frequent follow-up visits to maximize her success with intensive lifestyle modifications for her multiple health conditions.   Objective:   Blood pressure 139/89, pulse 81, temperature 98.2 F (36.8 C), height '5\' 7"'$  (1.702 m), weight (!) 358 lb (162.4 kg), SpO2 97 %. Body mass index is 56.07 kg/m.  General: Cooperative, alert, well developed, in no acute distress. HEENT: Conjunctivae and lids unremarkable. Cardiovascular: Regular rhythm.  Lungs: Normal work of breathing. Neurologic: No focal deficits.   Lab Results  Component Value Date   CREATININE 0.87 03/17/2020  BUN 12 03/17/2020   NA 138 03/17/2020   K 4.2 03/17/2020   CL 102 03/17/2020   CO2 21 03/17/2020   Lab Results  Component Value Date   ALT 13 03/17/2020   AST 19 03/17/2020   ALKPHOS 54 03/17/2020   BILITOT 0.3 03/17/2020   Lab Results  Component Value Date    HGBA1C 5.7 (H) 03/17/2020   Lab Results  Component Value Date   INSULIN 37.9 (H) 03/17/2020   No results found for: TSH No results found for: CHOL, HDL, LDLCALC, LDLDIRECT, TRIG, CHOLHDL Lab Results  Component Value Date   VD25OH 11.8 (L) 03/17/2020   No results found for: WBC, HGB, HCT, MCV, PLT No results found for: IRON, TIBC, FERRITIN  Attestation Statements:   Reviewed by clinician on day of visit: allergies, medications, problem list, medical history, surgical history, family history, social history, and previous encounter notes.  I, Lizbeth Bark, RMA, am acting as transcriptionist for Coralie Common, MD.   I have reviewed the above documentation for accuracy and completeness, and I agree with the above. - Coralie Common, MD

## 2022-01-12 ENCOUNTER — Encounter (INDEPENDENT_AMBULATORY_CARE_PROVIDER_SITE_OTHER): Payer: Self-pay | Admitting: Family Medicine

## 2022-01-12 ENCOUNTER — Other Ambulatory Visit (INDEPENDENT_AMBULATORY_CARE_PROVIDER_SITE_OTHER): Payer: Self-pay | Admitting: Family Medicine

## 2022-01-12 DIAGNOSIS — R632 Polyphagia: Secondary | ICD-10-CM

## 2022-01-13 ENCOUNTER — Other Ambulatory Visit (INDEPENDENT_AMBULATORY_CARE_PROVIDER_SITE_OTHER): Payer: Self-pay

## 2022-01-13 DIAGNOSIS — R632 Polyphagia: Secondary | ICD-10-CM

## 2022-02-08 ENCOUNTER — Ambulatory Visit (INDEPENDENT_AMBULATORY_CARE_PROVIDER_SITE_OTHER): Payer: BC Managed Care – PPO | Admitting: Family Medicine

## 2022-02-08 ENCOUNTER — Encounter (INDEPENDENT_AMBULATORY_CARE_PROVIDER_SITE_OTHER): Payer: Self-pay | Admitting: Family Medicine

## 2022-02-08 VITALS — BP 129/88 | HR 84 | Temp 98.4°F | Ht 67.0 in | Wt 358.0 lb

## 2022-02-08 DIAGNOSIS — F419 Anxiety disorder, unspecified: Secondary | ICD-10-CM

## 2022-02-08 DIAGNOSIS — E669 Obesity, unspecified: Secondary | ICD-10-CM

## 2022-02-08 DIAGNOSIS — F32A Depression, unspecified: Secondary | ICD-10-CM

## 2022-02-08 DIAGNOSIS — F418 Other specified anxiety disorders: Secondary | ICD-10-CM

## 2022-02-08 DIAGNOSIS — R632 Polyphagia: Secondary | ICD-10-CM

## 2022-02-08 DIAGNOSIS — Z6841 Body Mass Index (BMI) 40.0 and over, adult: Secondary | ICD-10-CM

## 2022-02-08 MED ORDER — OZEMPIC (0.25 OR 0.5 MG/DOSE) 2 MG/1.5ML ~~LOC~~ SOPN: 0.5000 mg | PEN_INJECTOR | SUBCUTANEOUS | 0 refills | Status: DC

## 2022-02-08 MED ORDER — SERTRALINE HCL 50 MG PO TABS: 50.0000 mg | ORAL_TABLET | Freq: Every day | ORAL | 0 refills | Status: DC

## 2022-02-09 NOTE — Progress Notes (Signed)
Chief Complaint:   OBESITY [name] is here to discuss her progress with her obesity treatment plan along with follow-up of her obesity related diagnoses. [name] is on the Category 3 Plan and states she is following her eating plan approximately 75% of the time. [name] states she is exercise 20 minutes 2-4 times per week.  Today's visit was #: 24 Starting weight: 375 lbs Starting date: 03/17/2020 Today's weight: 358 lbs Today's date: 02/08/2022 Total lbs lost to date: 17 lbs Total lbs lost since last in-office visit: 0  Interim History: [name] has been getting over recent illness. She is trying to stay mindful of choices and staying true to meal plan as best as she can. She has been doing quite a bit of house projects too. Wants to focus on getting all food in on Cat 3.   Subjective:   1. Polyphagia [name] is currently on Ozempic 0.5 mg weekly. Denies nausea/vomiting or constipation.   2. Depression with anxiety [name] is currently taking Zoloft 50 mg daily. Denies suicidal ideas, and homicidal ideas.  Assessment/Plan:   1. Polyphagia We will refill Ozempic 0.5 mg SubQ once weekly for 1 month with 0 refills.  -Refill Semaglutide,0.25 or 0.'5MG'$ /DOS, (OZEMPIC, 0.25 OR 0.5 MG/DOSE,) 2 MG/1.5ML SOPN; Inject 0.5 mg into the skin once a week.  Dispense: 3 mL; Refill: 0  2. Depression with anxiety We will refill Zoloft 50 mg by mouth daily for 3 months with 0 refills.  -Refill sertraline (ZOLOFT) 50 MG tablet; Take 1 tablet (50 mg total) by mouth daily.  Dispense: 90 tablet; Refill: 0  3. Obesity with current BMI of 56.1 [name] is currently in the action stage of change. As such, her goal is to continue with weight loss efforts. She has agreed to the Category 3 Plan.   Exercise goals: As is.  Behavioral modification strategies: increasing lean protein intake, meal planning and cooking strategies, keeping healthy foods in the home, and planning for success.  [name] has agreed to  follow-up with our clinic in 6 weeks. She was informed of the importance of frequent follow-up visits to maximize her success with intensive lifestyle modifications for her multiple health conditions.   Objective:   Blood pressure 129/88, pulse 84, temperature 98.4 F (36.9 C), height '5\' 7"'$  (1.702 m), weight (!) 358 lb (162.4 kg), SpO2 97 %. Body mass index is 56.07 kg/m.  General: Cooperative, alert, well developed, in no acute distress. HEENT: Conjunctivae and lids unremarkable. Cardiovascular: Regular rhythm.  Lungs: Normal work of breathing. Neurologic: No focal deficits.   Lab Results  Component Value Date   CREATININE 0.87 03/17/2020   BUN 12 03/17/2020   NA 138 03/17/2020   K 4.2 03/17/2020   CL 102 03/17/2020   CO2 21 03/17/2020   Lab Results  Component Value Date   ALT 13 03/17/2020   AST 19 03/17/2020   ALKPHOS 54 03/17/2020   BILITOT 0.3 03/17/2020   Lab Results  Component Value Date   HGBA1C 5.7 (H) 03/17/2020   Lab Results  Component Value Date   INSULIN 37.9 (H) 03/17/2020   No results found for: "TSH" No results found for: "CHOL", "HDL", "LDLCALC", "LDLDIRECT", "TRIG", "CHOLHDL" Lab Results  Component Value Date   VD25OH 11.8 (L) 03/17/2020   No results found for: "WBC", "HGB", "HCT", "MCV", "PLT" No results found for: "IRON", "TIBC", "FERRITIN"  Attestation Statements:   Reviewed by clinician on day of visit: allergies, medications, problem list, medical history, surgical history,  family history, social history, and previous encounter notes.  I, Elnora Morrison, RMA am acting as transcriptionist for Coralie Common, MD.  I have reviewed the above documentation for accuracy and completeness, and I agree with the above. - Coralie Common, MD

## 2022-02-14 ENCOUNTER — Encounter (INDEPENDENT_AMBULATORY_CARE_PROVIDER_SITE_OTHER): Payer: Self-pay | Admitting: Family Medicine

## 2022-02-15 NOTE — Telephone Encounter (Signed)
Dr.Ukleja 

## 2022-02-15 NOTE — Telephone Encounter (Signed)
Please advise 

## 2022-02-15 NOTE — Telephone Encounter (Signed)
FYI

## 2022-03-22 ENCOUNTER — Ambulatory Visit (INDEPENDENT_AMBULATORY_CARE_PROVIDER_SITE_OTHER): Payer: BC Managed Care – PPO | Admitting: Family Medicine

## 2022-03-24 ENCOUNTER — Encounter (INDEPENDENT_AMBULATORY_CARE_PROVIDER_SITE_OTHER): Payer: Self-pay

## 2022-03-24 ENCOUNTER — Ambulatory Visit (INDEPENDENT_AMBULATORY_CARE_PROVIDER_SITE_OTHER): Payer: BC Managed Care – PPO | Admitting: Family Medicine

## 2022-03-24 ENCOUNTER — Encounter (INDEPENDENT_AMBULATORY_CARE_PROVIDER_SITE_OTHER): Payer: Self-pay | Admitting: Family Medicine

## 2022-03-24 VITALS — BP 133/90 | HR 77 | Temp 98.1°F | Ht 67.0 in | Wt 361.0 lb

## 2022-03-24 DIAGNOSIS — R7303 Prediabetes: Secondary | ICD-10-CM | POA: Diagnosis not present

## 2022-03-24 DIAGNOSIS — Z6841 Body Mass Index (BMI) 40.0 and over, adult: Secondary | ICD-10-CM | POA: Diagnosis not present

## 2022-03-24 DIAGNOSIS — I10 Essential (primary) hypertension: Secondary | ICD-10-CM | POA: Diagnosis not present

## 2022-03-24 DIAGNOSIS — E669 Obesity, unspecified: Secondary | ICD-10-CM

## 2022-03-24 MED ORDER — OZEMPIC (0.25 OR 0.5 MG/DOSE) 2 MG/1.5ML ~~LOC~~ SOPN: 0.5000 mg | PEN_INJECTOR | SUBCUTANEOUS | 0 refills | Status: DC

## 2022-03-29 NOTE — Progress Notes (Signed)
Chief Complaint:   OBESITY [name] is here to discuss her progress with her obesity treatment plan along with follow-up of her obesity related diagnoses. [name] is on the Category 3 Plan and states she is following her eating plan approximately 50% of the time. [name] states she is swimming, strength,bike and walking 60 minutes 3 times per week.  Today's visit was #: 25 Starting weight: 375 lbs Starting date: 03/17/2020 Today's weight: 361 lbs Today's date: 03/24/2022 Total lbs lost to date: 14 lbs Total lbs lost since last in-office visit: 0  Interim History: [name]'s son is moving into college tomorrow and she voices it has been stressful and busy preparing for that. She has been focusing on keeping exercise consistent but has struggled with food aspect of plan. School is starting back up; goes back Aug 17th . Noticing a significant decrease in desire to eat and may eat only 2 meals /day.  Subjective:   1. Prediabetes [name]'s A1c recently decreased per patient. ( No results in Epic). She is currently on Ozempic 0.5 mg but missed 1 week due to running out.  2. Essential hypertension [name]'s blood pressure is borderline today. Denies chest pain, chest pressure and headache. She is currently taking Amlodipine 10 mg daily.  Assessment/Plan:   1. Prediabetes We will refill Ozempic 0.5 mg SubQ once weekly for 1 month with 0 refills.  -Refill Semaglutide,0.25 or 0.'5MG'$ /DOS, (OZEMPIC, 0.25 OR 0.5 MG/DOSE,) 2 MG/1.5ML SOPN; Inject 0.5 mg into the skin once a week.  Dispense: 3 mL; Refill: 0  2. Essential hypertension [name] will continue with current medications dose with no changes. We will follow up with blood pressure at next appointment.  3. Obesity with current BMI of 56.5 [name] is currently in the action stage of change. As such, her goal is to continue with weight loss efforts. She has agreed to the Category 3 Plan.   Exercise goals: All adults should avoid inactivity. Some  physical activity is better than none, and adults who participate in any amount of physical activity gain some health benefits.  Behavioral modification strategies: increasing lean protein intake, no skipping meals, meal planning and cooking strategies, keeping healthy foods in the home, and planning for success.  [name] has agreed to follow-up with our clinic in 4 weeks. She was informed of the importance of frequent follow-up visits to maximize her success with intensive lifestyle modifications for her multiple health conditions.   Objective:   Blood pressure (!) 133/90, pulse 77, temperature 98.1 F (36.7 C), height '5\' 7"'$  (1.702 m), weight (!) 361 lb (163.7 kg), SpO2 98 %. Body mass index is 56.54 kg/m.  General: Cooperative, alert, well developed, in no acute distress. HEENT: Conjunctivae and lids unremarkable. Cardiovascular: Regular rhythm.  Lungs: Normal work of breathing. Neurologic: No focal deficits.   Lab Results  Component Value Date   CREATININE 0.87 03/17/2020   BUN 12 03/17/2020   NA 138 03/17/2020   K 4.2 03/17/2020   CL 102 03/17/2020   CO2 21 03/17/2020   Lab Results  Component Value Date   ALT 13 03/17/2020   AST 19 03/17/2020   ALKPHOS 54 03/17/2020   BILITOT 0.3 03/17/2020   Lab Results  Component Value Date   HGBA1C 5.7 (H) 03/17/2020   Lab Results  Component Value Date   INSULIN 37.9 (H) 03/17/2020   No results found for: "TSH" No results found for: "CHOL", "HDL", "LDLCALC", "LDLDIRECT", "TRIG", "CHOLHDL" Lab Results  Component Value Date  VD25OH 11.8 (L) 03/17/2020   No results found for: "WBC", "HGB", "HCT", "MCV", "PLT" No results found for: "IRON", "TIBC", "FERRITIN"  Attestation Statements:   Reviewed by clinician on day of visit: allergies, medications, problem list, medical history, surgical history, family history, social history, and previous encounter notes.  I, Elnora Morrison, RMA am acting as transcriptionist for Coralie Common, MD. I have reviewed the above documentation for accuracy and completeness, and I agree with the above. - Coralie Common, MD

## 2022-04-22 ENCOUNTER — Ambulatory Visit (INDEPENDENT_AMBULATORY_CARE_PROVIDER_SITE_OTHER): Payer: BC Managed Care – PPO | Admitting: Family Medicine

## 2022-04-26 ENCOUNTER — Encounter (INDEPENDENT_AMBULATORY_CARE_PROVIDER_SITE_OTHER): Payer: Self-pay | Admitting: Family Medicine

## 2022-04-26 ENCOUNTER — Ambulatory Visit (INDEPENDENT_AMBULATORY_CARE_PROVIDER_SITE_OTHER): Payer: BC Managed Care – PPO | Admitting: Family Medicine

## 2022-04-26 VITALS — HR 81 | Temp 98.5°F | Ht 67.0 in | Wt 359.0 lb

## 2022-04-26 DIAGNOSIS — E669 Obesity, unspecified: Secondary | ICD-10-CM

## 2022-04-26 DIAGNOSIS — Z6841 Body Mass Index (BMI) 40.0 and over, adult: Secondary | ICD-10-CM

## 2022-04-26 DIAGNOSIS — E559 Vitamin D deficiency, unspecified: Secondary | ICD-10-CM | POA: Diagnosis not present

## 2022-04-26 DIAGNOSIS — R7303 Prediabetes: Secondary | ICD-10-CM | POA: Diagnosis not present

## 2022-04-26 MED ORDER — OZEMPIC (0.25 OR 0.5 MG/DOSE) 2 MG/1.5ML ~~LOC~~ SOPN: 0.5000 mg | PEN_INJECTOR | SUBCUTANEOUS | 0 refills | Status: DC

## 2022-04-28 NOTE — Progress Notes (Signed)
Chief Complaint:   OBESITY [name] is here to discuss her progress with her obesity treatment plan along with follow-up of her obesity related diagnoses. [name] is on the Category 3 Plan and states she is following her eating plan approximately 90% of the time. [name] states she is walking 45 minutes 5 times per week.  Today's visit was #: 26 Starting weight: 375 lbs Starting date: 03/17/2020 Today's weight: 359 lbs Today's date: 04/26/2022 Total lbs lost to date: 16 lbs Total lbs lost since last in-office visit: 2  Interim History: [name] has done very well following Cat 3 over the last few weeks. 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 Back to meal prepping and getting all food in. Feels mentality has shifted and definitely feels more comfortable. Eating dinner in 2 sittings.  Subjective:   1. Prediabetes [name] is doing well on Ozempic. Denies GI side effects.  2. Vitamin D deficiency [name] is not on prescription Vit d. Last Vit D level of 11.8.  Assessment/Plan:   1. Prediabetes We will refill Ozempic 0.5 mg SubQ once weekly for 1 month with 0 refills.  -Refill Semaglutide,0.25 or 0.'5MG'$ /DOS, (OZEMPIC, 0.25 OR 0.5 MG/DOSE,) 2 MG/1.5ML SOPN; Inject 0.5 mg into the skin once a week.  Dispense: 3 mL; Refill: 0  2. Vitamin D deficiency [name] will screenshot Vit D level from PCP. May need added supplementation.  3. Obesity with current BMI of 56.4 [name] is currently in the action stage of change. As such, her goal is to continue with weight loss efforts. She has agreed to the Category 3 Plan.   Exercise goals: All adults should avoid inactivity. Some physical activity is better than none, and adults who participate in any amount of physical activity gain some health benefits.  Behavioral modification strategies: increasing lean protein intake, meal planning and cooking strategies, and keeping healthy foods in the home.  [name] has agreed to follow-up with our clinic in  4 weeks. She was informed of the importance of frequent follow-up visits to maximize her success with intensive lifestyle modifications for her multiple health conditions.   Objective:   Pulse 81, temperature 98.5 F (36.9 C), height '5\' 7"'$  (1.702 m), weight (!) 359 lb (162.8 kg), SpO2 97 %. Body mass index is 56.23 kg/m.  General: Cooperative, alert, well developed, in no acute distress. HEENT: Conjunctivae and lids unremarkable. Cardiovascular: Regular rhythm.  Lungs: Normal work of breathing. Neurologic: No focal deficits.   Lab Results  Component Value Date   CREATININE 0.87 03/17/2020   BUN 12 03/17/2020   NA 138 03/17/2020   K 4.2 03/17/2020   CL 102 03/17/2020   CO2 21 03/17/2020   Lab Results  Component Value Date   ALT 13 03/17/2020   AST 19 03/17/2020   ALKPHOS 54 03/17/2020   BILITOT 0.3 03/17/2020   Lab Results  Component Value Date   HGBA1C 5.7 (H) 03/17/2020   Lab Results  Component Value Date   INSULIN 37.9 (H) 03/17/2020   No results found for: "TSH" No results found for: "CHOL", "HDL", "LDLCALC", "LDLDIRECT", "TRIG", "CHOLHDL" Lab Results  Component Value Date   VD25OH 11.8 (L) 03/17/2020   No results found for: "WBC", "HGB", "HCT", "MCV", "PLT" No results found for: "IRON", "TIBC", "FERRITIN"  Attestation Statements:   Reviewed by clinician on day of visit: allergies, medications, problem list, medical history, surgical history, family history, social history, and previous encounter notes.  I, Brendell Tyus, RMA am acting as transcriptionist  for Coralie Common, MD.  I have reviewed the above documentation for accuracy and completeness, and I agree with the above. - Coralie Common, MD

## 2022-05-24 ENCOUNTER — Encounter (INDEPENDENT_AMBULATORY_CARE_PROVIDER_SITE_OTHER): Payer: Self-pay | Admitting: Family Medicine

## 2022-05-24 ENCOUNTER — Ambulatory Visit (INDEPENDENT_AMBULATORY_CARE_PROVIDER_SITE_OTHER): Payer: BC Managed Care – PPO | Admitting: Family Medicine

## 2022-05-24 VITALS — BP 146/85 | HR 85 | Temp 98.4°F | Ht 67.0 in | Wt 356.0 lb

## 2022-05-24 DIAGNOSIS — E669 Obesity, unspecified: Secondary | ICD-10-CM | POA: Diagnosis not present

## 2022-05-24 DIAGNOSIS — Z6841 Body Mass Index (BMI) 40.0 and over, adult: Secondary | ICD-10-CM | POA: Diagnosis not present

## 2022-05-24 DIAGNOSIS — I10 Essential (primary) hypertension: Secondary | ICD-10-CM | POA: Diagnosis not present

## 2022-05-24 DIAGNOSIS — R7303 Prediabetes: Secondary | ICD-10-CM | POA: Diagnosis not present

## 2022-05-24 MED ORDER — OZEMPIC (0.25 OR 0.5 MG/DOSE) 2 MG/1.5ML ~~LOC~~ SOPN: 0.5000 mg | PEN_INJECTOR | SUBCUTANEOUS | 0 refills | Status: DC

## 2022-05-26 NOTE — Progress Notes (Signed)
Chief Complaint:   OBESITY [name] is here to discuss her progress with her obesity treatment plan along with follow-up of her obesity related diagnoses. [name] is on the Category 3 Plan and states she is following her eating plan approximately 85% of the time. [name] states she is exercising 30 minutes 5 times per week.  Today's visit was #: 27 Starting weight: 375 lbs Starting date: 03/17/2020 Today's weight: 356 lbs Today's date: 05/24/2022 Total lbs lost to date: 19 lbs Total lbs lost since last in-office visit: 3  Interim History: Vy lost her niece unexpectedly last week (9 year old likely due to complications from diabetes). Has divided workouts into am and pm. Work is very stressful:she is trying to leave work at work. She has tried to stay focused on boundaries and meal prep.he is using today to meal prep to prep for rest of week.  Subjective:   1. Prediabetes [name] is on Ozempic. Denies GI side effects. She is able to get all food in.  2. Essential hypertension [name]'s blood pressure slightly elevated today. Denies chest pain, chest pressure and headache. She is on Amlodipine.  Assessment/Plan:   1. Prediabetes We will refill Ozempic 0.5 mg SubQ once a week for 1 month with 0 refills.  -Refill Semaglutide,0.25 or 0.'5MG'$ /DOS, (OZEMPIC, 0.25 OR 0.5 MG/DOSE,) 2 MG/1.5ML SOPN; Inject 0.5 mg into the skin once a week.  Dispense: 3 mL; Refill: 0  2. Essential hypertension Continue Amlodipine without a change in dose.  3. Obesity with current BMI of 55.8 [name] is currently in the action stage of change. As such, her goal is to continue with weight loss efforts. She has agreed to the Category 3 Plan.   Exercise goals: As is.  Behavioral modification strategies: increasing lean protein intake, meal planning and cooking strategies, and keeping healthy foods in the home.  Heath has agreed to follow-up with our clinic in 6 weeks. She was informed of the importance  of frequent follow-up visits to maximize her success with intensive lifestyle modifications for her multiple health conditions.   Objective:   Blood pressure (!) 146/85, pulse 85, temperature 98.4 F (36.9 C), height '5\' 7"'$  (1.702 m), weight (!) 356 lb (161.5 kg), SpO2 98 %. Body mass index is 55.76 kg/m.  General: Cooperative, alert, well developed, in no acute distress. HEENT: Conjunctivae and lids unremarkable. Cardiovascular: Regular rhythm.  Lungs: Normal work of breathing. Neurologic: No focal deficits.   Lab Results  Component Value Date   CREATININE 0.87 03/17/2020   BUN 12 03/17/2020   NA 138 03/17/2020   K 4.2 03/17/2020   CL 102 03/17/2020   CO2 21 03/17/2020   Lab Results  Component Value Date   ALT 13 03/17/2020   AST 19 03/17/2020   ALKPHOS 54 03/17/2020   BILITOT 0.3 03/17/2020   Lab Results  Component Value Date   HGBA1C 5.7 (H) 03/17/2020   Lab Results  Component Value Date   INSULIN 37.9 (H) 03/17/2020   No results found for: "TSH" No results found for: "CHOL", "HDL", "LDLCALC", "LDLDIRECT", "TRIG", "CHOLHDL" Lab Results  Component Value Date   VD25OH 11.8 (L) 03/17/2020   No results found for: "WBC", "HGB", "HCT", "MCV", "PLT" No results found for: "IRON", "TIBC", "FERRITIN"  Attestation Statements:   Reviewed by clinician on day of visit: allergies, medications, problem list, medical history, surgical history, family history, social history, and previous encounter notes.  I, Elnora Morrison, RMA am acting as Location manager for [place]  Jearld Shines, MD.  I have reviewed the above documentation for accuracy and completeness, and I agree with the above. - Coralie Common, MD

## 2022-06-27 ENCOUNTER — Other Ambulatory Visit (INDEPENDENT_AMBULATORY_CARE_PROVIDER_SITE_OTHER): Payer: Self-pay | Admitting: Family Medicine

## 2022-06-27 DIAGNOSIS — R7303 Prediabetes: Secondary | ICD-10-CM

## 2022-06-28 ENCOUNTER — Other Ambulatory Visit (INDEPENDENT_AMBULATORY_CARE_PROVIDER_SITE_OTHER): Payer: Self-pay | Admitting: Family Medicine

## 2022-06-28 DIAGNOSIS — R7303 Prediabetes: Secondary | ICD-10-CM

## 2022-06-28 MED ORDER — OZEMPIC (0.25 OR 0.5 MG/DOSE) 2 MG/1.5ML ~~LOC~~ SOPN: 0.5000 mg | PEN_INJECTOR | SUBCUTANEOUS | 0 refills | Status: DC

## 2022-06-29 ENCOUNTER — Other Ambulatory Visit (INDEPENDENT_AMBULATORY_CARE_PROVIDER_SITE_OTHER): Payer: Self-pay | Admitting: Family Medicine

## 2022-06-29 DIAGNOSIS — R7303 Prediabetes: Secondary | ICD-10-CM

## 2022-07-03 ENCOUNTER — Other Ambulatory Visit (INDEPENDENT_AMBULATORY_CARE_PROVIDER_SITE_OTHER): Payer: Self-pay | Admitting: Family Medicine

## 2022-07-03 DIAGNOSIS — F418 Other specified anxiety disorders: Secondary | ICD-10-CM

## 2022-07-05 ENCOUNTER — Other Ambulatory Visit (INDEPENDENT_AMBULATORY_CARE_PROVIDER_SITE_OTHER): Payer: Self-pay | Admitting: Family Medicine

## 2022-07-05 ENCOUNTER — Ambulatory Visit (INDEPENDENT_AMBULATORY_CARE_PROVIDER_SITE_OTHER): Payer: BC Managed Care – PPO | Admitting: Family Medicine

## 2022-07-05 ENCOUNTER — Encounter (INDEPENDENT_AMBULATORY_CARE_PROVIDER_SITE_OTHER): Payer: Self-pay | Admitting: Family Medicine

## 2022-07-05 VITALS — BP 142/92 | HR 83 | Temp 98.4°F | Ht 67.0 in | Wt 359.0 lb

## 2022-07-05 DIAGNOSIS — R195 Other fecal abnormalities: Secondary | ICD-10-CM

## 2022-07-05 DIAGNOSIS — E669 Obesity, unspecified: Secondary | ICD-10-CM | POA: Diagnosis not present

## 2022-07-05 DIAGNOSIS — Z6841 Body Mass Index (BMI) 40.0 and over, adult: Secondary | ICD-10-CM

## 2022-07-05 DIAGNOSIS — R7303 Prediabetes: Secondary | ICD-10-CM

## 2022-07-05 DIAGNOSIS — I10 Essential (primary) hypertension: Secondary | ICD-10-CM

## 2022-07-05 MED ORDER — CHLORTHALIDONE 25 MG PO TABS: 12.5000 mg | ORAL_TABLET | Freq: Every day | ORAL | 0 refills | Status: DC

## 2022-07-05 MED ORDER — OZEMPIC (0.25 OR 0.5 MG/DOSE) 2 MG/1.5ML ~~LOC~~ SOPN: 0.5000 mg | PEN_INJECTOR | SUBCUTANEOUS | 0 refills | Status: DC

## 2022-07-12 ENCOUNTER — Encounter (HOSPITAL_COMMUNITY): Payer: Self-pay | Admitting: *Deleted

## 2022-07-12 ENCOUNTER — Ambulatory Visit (HOSPITAL_COMMUNITY)
Admission: EM | Admit: 2022-07-12 | Discharge: 2022-07-12 | Disposition: A | Discharge status: Home / Self Care | Payer: BC Managed Care – PPO

## 2022-07-12 ENCOUNTER — Ambulatory Visit (INDEPENDENT_AMBULATORY_CARE_PROVIDER_SITE_OTHER): Payer: BC Managed Care – PPO

## 2022-07-12 DIAGNOSIS — S6991XA Unspecified injury of right wrist, hand and finger(s), initial encounter: Secondary | ICD-10-CM

## 2022-07-12 DIAGNOSIS — S61312A Laceration without foreign body of right middle finger with damage to nail, initial encounter: Secondary | ICD-10-CM

## 2022-07-12 DIAGNOSIS — M79644 Pain in right finger(s): Secondary | ICD-10-CM

## 2022-07-12 DIAGNOSIS — Z23 Encounter for immunization: Secondary | ICD-10-CM | POA: Diagnosis not present

## 2022-07-12 MED ORDER — TETANUS-DIPHTH-ACELL PERTUSSIS 5-2.5-18.5 LF-MCG/0.5 IM SUSY
0.5000 mL | PREFILLED_SYRINGE | Freq: Once | INTRAMUSCULAR | Status: AC
  Administered 2022-07-12: 0.5 mL via INTRAMUSCULAR

## 2022-07-12 MED ORDER — TETANUS-DIPHTH-ACELL PERTUSSIS 5-2.5-18.5 LF-MCG/0.5 IM SUSY
PREFILLED_SYRINGE | INTRAMUSCULAR | Status: AC
  Filled 2022-07-12: qty 0.5

## 2022-07-12 NOTE — ED Provider Notes (Signed)
[place]    CSN: [id number] Arrival date & time: 07/12/22  1348     History   Chief Complaint Chief Complaint  Patient presents with   Finger Injury    HPI [name] is a 51 y.o. female.  Presents after finger injury Reports she slammed right hand middle finger in a car door.  Nail damaged and fell off At first was bleeding a lot but has stopped with pressure 4/10 pain in clinic  Last tetanus over 25 years ago  Past Medical History:  Diagnosis Date   Abdominal pain    Anxiety    Back pain    Depression    Heartburn    Hypertension    Joint pain    Knee pain    Obesity    Tired     Patient Active Problem List   Diagnosis Date Noted   HTN (hypertension), benign 05/05/2020   Morbid obesity ([place]) 04/28/2020    Past Surgical History:  Procedure Laterality Date   CESAREAN SECTION      OB History     Gravida  1   Para      Term      Preterm      AB      Living  1      SAB      IAB      Ectopic      Multiple      Live Births               Home Medications    Prior to Admission medications   Medication Sig Start Date End Date Taking? Authorizing Provider  lisinopril-hydrochlorothiazide (ZESTORETIC) 20-12.5 MG tablet Take 1 tablet by mouth daily.   Yes [provider]  amLODipine (NORVASC) 10 MG tablet Take 10 mg by mouth daily.    [provider]  busPIRone (BUSPAR) 7.5 MG tablet Take 1 tablet (7.5 mg total) by mouth 3 (three) times daily as needed (anxiety). 02/04/21   Laqueta Linden, MD  chlorthalidone (HYGROTON) 25 MG tablet Take 0.5 tablets (12.5 mg total) by mouth daily. 07/05/22   Laqueta Linden, MD  ibuprofen (ADVIL) 200 MG tablet Take 200 mg by mouth every 6 (six) hours as needed.    [provider]  norethindrone-ethinyl estradiol (LOESTRIN) 1-20 MG-MCG tablet Take 1 tablet by mouth daily. 03/16/21   Laqueta Linden, MD  Semaglutide,0.25 or 0.'5MG'$ /DOS, (OZEMPIC, 0.25  OR 0.5 MG/DOSE,) 2 MG/1.5ML SOPN Inject 0.5 mg into the skin once a week. 07/05/22   Laqueta Linden, MD  sertraline (ZOLOFT) 50 MG tablet Take 1 tablet (50 mg total) by mouth daily. 02/08/22   Laqueta Linden, MD    Family History Family History  Problem Relation Age of Onset   Cancer Other    Hypertension Other    Anxiety disorder Mother    Depression Mother    Diabetes Father    Hypertension Father    Heart disease Father    Sudden death Father    Stroke Father    Cancer Father    Sleep apnea Father    Obesity Father     Social History Social History   Tobacco Use   Smoking status: Former    Types: Cigarettes    Quit date: 2015    Years since quitting: 8.9   Smokeless tobacco: Never     Allergies   Guaifenesin & derivatives and Lisinopril-hydrochlorothiazide   Review of Systems  Review of Systems  Per HPI  Physical Exam Triage Vital Signs ED Triage Vitals  Enc Vitals Group     BP 07/12/22 1504 (!) 181/92     Pulse Rate 07/12/22 1504 83     Resp 07/12/22 1504 17     Temp 07/12/22 1504 98.5 F (36.9 C)     Temp Source 07/12/22 1504 Oral     SpO2 07/12/22 1504 94 %     Weight --      Height --      Head Circumference --      Peak Flow --      Pain Score 07/12/22 1506 4     Pain Loc --      Pain Edu? --      Excl. in [place]? --    No data found.  Updated Vital Signs BP (!) 144/105   Pulse 81   Temp 98.5 F (36.9 C) (Oral)   Resp 18   SpO2 97%     Physical Exam Vitals and nursing note reviewed.  Constitutional:      General: She is not in acute distress. HENT:     Mouth/Throat:     Pharynx: Oropharynx is clear.  Cardiovascular:     Rate and Rhythm: Normal rate and regular rhythm.     Pulses: Normal pulses.     Heart sounds: Normal heart sounds.  Pulmonary:     Effort: Pulmonary effort is normal.     Breath sounds: Normal breath sounds.  Skin:    General: Skin is warm and dry.     Findings: Wound present.     Comments: Right  hand middle finger nail is absent, bleeding controlled. No obvious deformity of the finger.  Distal sensation intact.  Neurological:     Mental Status: She is alert and oriented to person, place, and time.    UC Treatments / Results  Labs (all labs ordered are listed, but only abnormal results are displayed) Labs Reviewed - No data to display  EKG   Radiology DG Finger Middle Right  Result Date: 07/12/2022 CLINICAL DATA:  nail damage after blunt injury EXAM: RIGHT MIDDLE FINGER 2+V COMPARISON:  None Available. FINDINGS: No evidence of acute fracture. Alignment is normal. No bony erosion. Mild soft tissue swelling. IMPRESSION: No acute osseous abnormality. Electronically Signed   By: Maurine Simmering M.D.   On: 07/12/2022 15:43    Procedures Procedures   Medications Ordered in UC Medications  Tdap (BOOSTRIX) injection 0.5 mL (has no administration in time range)    Initial Impression / Assessment and Plan / UC Course  I have reviewed the triage vital signs and the nursing notes.  Pertinent labs & imaging results that were available during my care of the patient were reviewed by me and considered in my medical decision making (see chart for details).  Bleeding is controlled Middle finger xray negative.  Wound care, cleansed area Discussed nail may or may not grow back. She has small nailbed portion still remaining which is promising.  Wrapped with vasoline gauze Can keep covered for now, until scab forms or until nail grows back. Recommend topical abx ointment.  Tylenol/ibuprofen for pain control Tetanus updated in clinic today. Return precautions discussed. Patient agrees to plan  Final Clinical Impressions(s) / UC Diagnoses   Final diagnoses:  Injury of finger of right hand, initial encounter  Laceration of right middle finger without foreign body with damage to nail, initial encounter     Discharge  Instructions      We have updated your tetanus today.  You can  apply antibiotic ointment topically twice daily.  Keep the finger wrapped to prevent further damage.  You can use the Vaseline gauze to prevent snagging.  Monitor for signs of infection as discussed.  The nail may or may not grow back, but you do have a portion of the nailbed remaining that is promising!     ED Prescriptions   None    PDMP not reviewed this encounter.   Zakya Halabi, [place], [place] 07/12/22 1609

## 2022-07-12 NOTE — ED Notes (Signed)
Supplies given for home care. Signs of infection reviewed with patient.

## 2022-07-12 NOTE — ED Triage Notes (Signed)
Patient slammed right distal end of middle finger in car door pta. Patients nail is missing. Bleeding has stopped. No deformity noted.

## 2022-07-12 NOTE — Discharge Instructions (Addendum)
We have updated your tetanus today.  You can apply antibiotic ointment topically twice daily.  Keep the finger wrapped to prevent further damage.  You can use the Vaseline gauze to prevent snagging.  Monitor for signs of infection as discussed.  The nail may or may not grow back, but you do have a portion of the nailbed remaining that is promising!

## 2022-07-19 NOTE — Progress Notes (Signed)
Chief Complaint:   OBESITY [name] is here to discuss her progress with her obesity treatment plan along with follow-up of her obesity related diagnoses. [name] is on the Category 3 Plan and states she is following her eating plan approximately 85% of the time. 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  Today's visit was #: 28 Starting weight: 375 lbs Starting date: 03/17/2020 Today's weight: 359 lbs Today's date: 07/05/2022 Total lbs lost to date: 16 lbs Total lbs lost since last in-office visit: 0  Interim History: [name] has been trying to survive the last few weeks. She has felt that many family and friends are trying to lean on her. She has gone down a dress size (now 22/24). Recognizes her mentality and thought process around eating has changed. She is going to her mom's for Thanksgiving.  Subjective:   1. Prediabetes [name] is currently on Ozempic with some control of food choices and intake. Her last labs done with PCP but are not in Epic.  2. Positive colorectal cancer screening using Cologuard test [name] saw GI and has a coloscopy scheduled on Jan 2nd. 1st time for colonoscopy.  3. Essential hypertension [name] has somewhat intermittent headaches. Blood pressure elevated again today. Discussed listed allergies and patient cannot recall side effect/allergy.  Assessment/Plan:   1. Prediabetes We will refill Ozempic 0.5 mg SubQ once a week for 1 month with 0 refills.  -Refill Semaglutide,0.25 or 0.'5MG'$ /DOS, (OZEMPIC, 0.25 OR 0.5 MG/DOSE,) 2 MG/1.5ML SOPN; Inject 0.5 mg into the skin once a week.  Dispense: 3 mL; Refill: 0  2. Positive colorectal cancer screening using Cologuard test Discussed prep as well as how to prepare clear liquids for prep day.  3. Essential hypertension START Chlorthalidone 25 mg 1/2 by mouth daily for 1 month with 0 refills. We discussed having Benadryl on hand as well as other side effects of  Chlorthalidone.  -Start chlorthalidone (HYGROTON) 25 MG tablet; Take 0.5 tablets (12.5 mg total) by mouth daily.  Dispense: 30 tablet; Refill: 0  4. Obesity with current BMI of 56.3 [name] is currently in the action stage of change. As such, her goal is to continue with weight loss efforts. She has agreed to the Category 3 Plan.   Exercise goals: All adults should avoid inactivity. Some physical activity is better than none, and adults who participate in any amount of physical activity gain some health benefits.  [name] is to continue chair exercises.  Behavioral modification strategies: increasing lean protein intake, meal planning and cooking strategies, keeping healthy foods in the home, holiday eating strategies , and planning for success.  [name] has agreed to follow-up with our clinic in 4 weeks. She was informed of the importance of frequent follow-up visits to maximize her success with intensive lifestyle modifications for her multiple health conditions.   Objective:   Blood pressure (!) 142/92, pulse 83, temperature 98.4 F (36.9 C), height '5\' 7"'$  (1.702 m), weight (!) 359 lb (162.8 kg), SpO2 98 %. Body mass index is 56.23 kg/m.  General: Cooperative, alert, well developed, in no acute distress. HEENT: Conjunctivae and lids unremarkable. Cardiovascular: Regular rhythm.  Lungs: Normal work of breathing. Neurologic: No focal deficits.   Lab Results  Component Value Date   CREATININE 0.87 03/17/2020   BUN 12 03/17/2020   NA 138 03/17/2020   K 4.2 03/17/2020   CL 102 03/17/2020   CO2 21 03/17/2020   Lab Results  Component Value  Date   ALT 13 03/17/2020   AST 19 03/17/2020   ALKPHOS 54 03/17/2020   BILITOT 0.3 03/17/2020   Lab Results  Component Value Date   HGBA1C 5.7 (H) 03/17/2020   Lab Results  Component Value Date   INSULIN 37.9 (H) 03/17/2020   No results found for: "TSH" No results found for: "CHOL", "HDL", "LDLCALC", "LDLDIRECT", "TRIG", "CHOLHDL" Lab  Results  Component Value Date   VD25OH 11.8 (L) 03/17/2020   No results found for: "WBC", "HGB", "HCT", "MCV", "PLT" No results found for: "IRON", "TIBC", "FERRITIN"  Attestation Statements:   Reviewed by clinician on day of visit: allergies, medications, problem list, medical history, surgical history, family history, social history, and previous encounter notes.  I, Elnora Morrison, RMA am acting as transcriptionist for Coralie Common, MD.  I have reviewed the above documentation for accuracy and completeness, and I agree with the above. - Coralie Common, MD

## 2022-08-02 ENCOUNTER — Ambulatory Visit (INDEPENDENT_AMBULATORY_CARE_PROVIDER_SITE_OTHER): Payer: Self-pay | Admitting: Family Medicine

## 2022-08-02 ENCOUNTER — Other Ambulatory Visit (INDEPENDENT_AMBULATORY_CARE_PROVIDER_SITE_OTHER): Payer: Self-pay | Admitting: Family Medicine

## 2022-08-02 DIAGNOSIS — F418 Other specified anxiety disorders: Secondary | ICD-10-CM

## 2022-08-02 DIAGNOSIS — R7303 Prediabetes: Secondary | ICD-10-CM

## 2022-08-04 ENCOUNTER — Ambulatory Visit (INDEPENDENT_AMBULATORY_CARE_PROVIDER_SITE_OTHER): Payer: BC Managed Care – PPO | Admitting: Family Medicine

## 2022-08-04 ENCOUNTER — Encounter (INDEPENDENT_AMBULATORY_CARE_PROVIDER_SITE_OTHER): Payer: Self-pay | Admitting: Family Medicine

## 2022-08-04 VITALS — BP 134/85 | HR 86 | Temp 98.5°F | Ht 67.0 in | Wt 352.0 lb

## 2022-08-04 DIAGNOSIS — E669 Obesity, unspecified: Secondary | ICD-10-CM

## 2022-08-04 DIAGNOSIS — R7303 Prediabetes: Secondary | ICD-10-CM

## 2022-08-04 DIAGNOSIS — E559 Vitamin D deficiency, unspecified: Secondary | ICD-10-CM

## 2022-08-04 DIAGNOSIS — Z6841 Body Mass Index (BMI) 40.0 and over, adult: Secondary | ICD-10-CM

## 2022-08-04 DIAGNOSIS — F418 Other specified anxiety disorders: Secondary | ICD-10-CM | POA: Diagnosis not present

## 2022-08-04 MED ORDER — OZEMPIC (0.25 OR 0.5 MG/DOSE) 2 MG/1.5ML ~~LOC~~ SOPN: 0.5000 mg | PEN_INJECTOR | SUBCUTANEOUS | 0 refills | Status: DC

## 2022-08-04 MED ORDER — SERTRALINE HCL 50 MG PO TABS: 50.0000 mg | ORAL_TABLET | Freq: Every day | ORAL | 0 refills | Status: DC

## 2022-08-04 MED ORDER — VITAMIN D (ERGOCALCIFEROL) 1.25 MG (50000 UNIT) PO CAPS: 50000.0000 [IU] | ORAL_CAPSULE | ORAL | 0 refills | Status: DC

## 2022-08-11 ENCOUNTER — Other Ambulatory Visit: Payer: Self-pay | Admitting: Gastroenterology

## 2022-08-12 ENCOUNTER — Encounter (HOSPITAL_COMMUNITY): Payer: Self-pay | Admitting: Gastroenterology

## 2022-08-17 ENCOUNTER — Encounter (HOSPITAL_COMMUNITY)
Admission: RE | Disposition: A | Discharge status: Home / Self Care | Payer: Self-pay | Source: Ambulatory Visit | Attending: Gastroenterology

## 2022-08-17 ENCOUNTER — Ambulatory Visit (HOSPITAL_COMMUNITY): Payer: BC Managed Care – PPO | Admitting: Certified Registered Nurse Anesthetist

## 2022-08-17 ENCOUNTER — Ambulatory Visit (HOSPITAL_COMMUNITY)
Admission: RE | Admit: 2022-08-17 | Discharge: 2022-08-17 | Disposition: A | Discharge status: Home / Self Care | Payer: BC Managed Care – PPO | Source: Ambulatory Visit | Attending: Gastroenterology | Admitting: Gastroenterology

## 2022-08-17 ENCOUNTER — Encounter (HOSPITAL_COMMUNITY): Payer: Self-pay | Admitting: Gastroenterology

## 2022-08-17 ENCOUNTER — Other Ambulatory Visit: Payer: Self-pay

## 2022-08-17 DIAGNOSIS — R195 Other fecal abnormalities: Secondary | ICD-10-CM | POA: Insufficient documentation

## 2022-08-17 DIAGNOSIS — Z7985 Long-term (current) use of injectable non-insulin antidiabetic drugs: Secondary | ICD-10-CM | POA: Diagnosis not present

## 2022-08-17 DIAGNOSIS — D128 Benign neoplasm of rectum: Secondary | ICD-10-CM | POA: Insufficient documentation

## 2022-08-17 DIAGNOSIS — D123 Benign neoplasm of transverse colon: Secondary | ICD-10-CM | POA: Diagnosis not present

## 2022-08-17 DIAGNOSIS — Z87891 Personal history of nicotine dependence: Secondary | ICD-10-CM | POA: Diagnosis not present

## 2022-08-17 DIAGNOSIS — Z1211 Encounter for screening for malignant neoplasm of colon: Secondary | ICD-10-CM | POA: Diagnosis not present

## 2022-08-17 DIAGNOSIS — Z6841 Body Mass Index (BMI) 40.0 and over, adult: Secondary | ICD-10-CM | POA: Diagnosis not present

## 2022-08-17 DIAGNOSIS — D125 Benign neoplasm of sigmoid colon: Secondary | ICD-10-CM | POA: Insufficient documentation

## 2022-08-17 DIAGNOSIS — I10 Essential (primary) hypertension: Secondary | ICD-10-CM | POA: Insufficient documentation

## 2022-08-17 DIAGNOSIS — D122 Benign neoplasm of ascending colon: Secondary | ICD-10-CM | POA: Insufficient documentation

## 2022-08-17 HISTORY — PX: POLYPECTOMY: SHX5525

## 2022-08-17 HISTORY — PX: COLONOSCOPY WITH PROPOFOL: SHX5780

## 2022-08-17 HISTORY — PX: HEMOSTASIS CLIP PLACEMENT: SHX6857

## 2022-08-17 SURGERY — COLONOSCOPY WITH PROPOFOL
Anesthesia: Monitor Anesthesia Care

## 2022-08-17 MED ORDER — LACTATED RINGERS IV SOLN: INTRAVENOUS | Status: DC

## 2022-08-17 MED ORDER — PROPOFOL 10 MG/ML IV BOLUS
INTRAVENOUS | Status: DC | PRN
  Administered 2022-08-17: 50 mg via INTRAVENOUS

## 2022-08-17 MED ORDER — PROPOFOL 500 MG/50ML IV EMUL
INTRAVENOUS | Status: DC | PRN
  Administered 2022-08-17: 100 ug/kg/min via INTRAVENOUS

## 2022-08-17 MED ORDER — LIDOCAINE 2% (20 MG/ML) 5 ML SYRINGE
INTRAMUSCULAR | Status: DC | PRN
  Administered 2022-08-17: 100 mg via INTRAVENOUS

## 2022-08-17 MED ORDER — SODIUM CHLORIDE 0.9 % IV SOLN: INTRAVENOUS | Status: DC

## 2022-08-17 SURGICAL SUPPLY — 22 items

## 2022-08-17 NOTE — H&P (Signed)
[name] HPI: This 52 year old black female presents to the office for colorectal cancer screening. She had a positive Cologuard stool DNA test done on 04/09/2022. She has 4 BM's per day with no obvious blood or mucus in the stool. She has a good appetite and has lost 30 pounds over the last 2 years on Ozempic. She is also going to [employer] [facility] loss program.  She denies having any complaints of abdominal pain, nausea, vomiting, acid reflux, dysphagia or odynophagia. She denies having a family history of colon cancer, celiac sprue or IBD.  Past Medical History:  Diagnosis Date   Abdominal pain    Anxiety    Back pain    Depression    Heartburn    Hypertension    Joint pain    Knee pain    Obesity    Tired     Past Surgical History:  Procedure Laterality Date   CESAREAN SECTION     NO PAST SURGERIES      Family History  Problem Relation Age of Onset   Cancer Other    Hypertension Other    Anxiety disorder Mother    Depression Mother    Diabetes Father    Hypertension Father    Heart disease Father    Sudden death Father    Stroke Father    Cancer Father    Sleep apnea Father    Obesity Father     Social History:  reports that she quit smoking about 9 years ago. Her smoking use included cigarettes. She has never used smokeless tobacco. She reports current alcohol use. She reports that she does not use drugs.  Allergies:  Allergies  Allergen Reactions   Guaifenesin & Derivatives Hives and Swelling    Breakout on face and extremities   Lisinopril-Hydrochlorothiazide Nausea Only    Patient reports she is not allergic and is currently on this medication Dizzy    Medications: Scheduled: Continuous:  sodium chloride     lactated ringers 10 mL/hr at 08/17/22 1300    No results found for this or any previous visit (from the past 24 hour(s)).   No results found.  ROS:  As stated above in the HPI otherwise negative.  Blood pressure (!)  154/92, pulse 87, temperature (!) 97.2 F (36.2 C), temperature source Temporal, resp. rate 12, height '5\' 7"'$  (1.702 m), weight (!) 159.7 kg, SpO2 97 %.    PE: Gen: NAD, Alert and Oriented HEENT:  [place]/AT, EOMI Neck: Supple, no LAD Lungs: CTA Bilaterally CV: RRR without M/G/R ABD: Soft, NTND, +BS Ext: No C/C/E  Assessment/Plan: 1) Screening colonoscopy.  [name] D 08/17/2022, 1:04 PM

## 2022-08-17 NOTE — Discharge Instructions (Signed)

## 2022-08-17 NOTE — Transfer of Care (Signed)
Immediate Anesthesia Transfer of Care Note  Patient: [name]  Procedure(s) Performed: COLONOSCOPY WITH PROPOFOL POLYPECTOMY  Patient Location: PACU  Anesthesia Type:MAC  Level of Consciousness: awake, alert , oriented, and patient cooperative  Airway & Oxygen Therapy: Patient Spontanous Breathing and Patient connected to face mask oxygen  Post-op Assessment: Report given to RN and Post -op Vital signs reviewed and stable  Post vital signs: Reviewed and stable  Last Vitals:  Vitals Value Taken Time  BP    Temp    Pulse 85 08/17/22 1340  Resp 17 08/17/22 1340  SpO2 97 % 08/17/22 1340  Vitals shown include unvalidated device data.  Last Pain:  Vitals:   08/17/22 1256  TempSrc: Temporal  PainSc: 0-No pain         Complications: No notable events documented.

## 2022-08-17 NOTE — Anesthesia Preprocedure Evaluation (Signed)
Anesthesia Evaluation  Patient identified by MRN, date of birth, ID band Patient awake    Reviewed: Allergy & Precautions, NPO status , Patient's Chart, lab work & pertinent test results  Airway Mallampati: II  TM Distance: >3 FB Neck ROM: Full    Dental  (+) Teeth Intact, Dental Advisory Given   Pulmonary former smoker   Pulmonary exam normal breath sounds clear to auscultation       Cardiovascular hypertension, Pt. on medications Normal cardiovascular exam Rhythm:Regular Rate:Normal     Neuro/Psych  PSYCHIATRIC DISORDERS Anxiety Depression    negative neurological ROS     GI/Hepatic negative GI ROS, Neg liver ROS,,,  Endo/Other    Morbid obesity (BMI 55)  Renal/GU negative Renal ROS     Musculoskeletal negative musculoskeletal ROS (+)    Abdominal   Peds  Hematology negative hematology ROS (+)   Anesthesia Other Findings Day of surgery medications reviewed with the patient.  Reproductive/Obstetrics                             Anesthesia Physical Anesthesia Plan  ASA: 4  Anesthesia Plan: MAC   Post-op Pain Management:    Induction: Intravenous  PONV Risk Score and Plan: 2 and TIVA and Treatment may vary due to age or medical condition  Airway Management Planned: Natural Airway and Simple Face Mask  Additional Equipment:   Intra-op Plan:   Post-operative Plan:   Informed Consent: I have reviewed the patients History and Physical, chart, labs and discussed the procedure including the risks, benefits and alternatives for the proposed anesthesia with the patient or authorized representative who has indicated his/her understanding and acceptance.     Dental advisory given  Plan Discussed with: CRNA and Anesthesiologist  Anesthesia Plan Comments:        Anesthesia Quick Evaluation

## 2022-08-17 NOTE — Op Note (Signed)
[facility] Patient Name: [name] Procedure Date: 08/17/2022 MRN: [id number] Attending MD: Carol Ada , MD, [id number] Date of Birth: Dec 20, 1970 CSN: [id number] Age: 52 Admit Type: Outpatient Procedure:                Colonoscopy Indications:              Screening for colorectal malignant neoplasm Providers:                Carol Ada, MD, William Dalton, Technician,                            Cathlean Marseilles Referring MD:              Medicines:                Propofol per Anesthesia Complications:            No immediate complications. Estimated Blood Loss:     Estimated blood loss: none. Procedure:                Pre-Anesthesia Assessment:                           - Prior to the procedure, a History and Physical                            was performed, and patient medications and                            allergies were reviewed. The patient's tolerance of                            previous anesthesia was also reviewed. The risks                            and benefits of the procedure and the sedation                            options and risks were discussed with the patient.                            All questions were answered, and informed consent                            was obtained. Prior Anticoagulants: The patient has                            taken no anticoagulant or antiplatelet agents. ASA                            Grade Assessment: III - A patient with severe                            systemic disease. After reviewing the risks and  benefits, the patient was deemed in satisfactory                            condition to undergo the procedure.                           - Sedation was administered by an anesthesia                            professional. Deep sedation was attained.                           After obtaining informed consent, the colonoscope                            was passed under  direct vision. Throughout the                            procedure, the patient's blood pressure, pulse, and                            oxygen saturations were monitored continuously. The                            [id number]) Olympus colonoscope was                            introduced through the anus and advanced to the the                            cecum, identified by appendiceal orifice and                            ileocecal valve. The colonoscopy was performed                            without difficulty. The patient tolerated the                            procedure well. The quality of the bowel                            preparation was evaluated using the BBPS [facility]                            Bowel Preparation Scale) with scores of: Right                            Colon = 3 (entire mucosa seen well with no residual                            staining, small fragments of stool or opaque  liquid), Transverse Colon = 3 (entire mucosa seen                            well with no residual staining, small fragments of                            stool or opaque liquid) and Left Colon = 3 (entire                            mucosa seen well with no residual staining, small                            fragments of stool or opaque liquid). The total                            BBPS score equals 9. The quality of the bowel                            preparation was good. The ileocecal valve,                            appendiceal orifice, and rectum were photographed. Scope In: 1:12:39 PM Scope Out: 1:34:00 PM Scope Withdrawal Time: 0 hours 19 minutes 43 seconds  Total Procedure Duration: 0 hours 21 minutes 21 seconds  Findings:      Five sessile polyps were found in the rectum, transverse colon and       ascending colon. The polyps were 2 to 10 mm in size. These polyps were       removed with a cold snare. Resection and retrieval were complete.       A 6 mm polyp was found in the sigmoid colon. The polyp was pedunculated.       The polyp was removed with a hot snare. Resection and retrieval were       complete. To prevent bleeding post-intervention, one hemostatic clip was       successfully placed (MR safe). Clip manufacturer: [employer].       There was no bleeding at the end of the procedure.      Technical issues precluded the image capture of all the polyps. Impression:               - Five 2 to 10 mm polyps in the rectum, in the                            transverse colon and in the ascending colon,                            removed with a cold snare. Resected and retrieved.                           - One 6 mm polyp in the sigmoid colon, removed with                            a hot snare. Resected and retrieved. Moderate Sedation:  Not Applicable - Patient had care per Anesthesia. Recommendation:           - Patient has a contact number available for                            emergencies. The signs and symptoms of potential                            delayed complications were discussed with the                            patient. Return to normal activities tomorrow.                            Written discharge instructions were provided to the                            patient.                           - Resume previous diet.                           - Continue present medications.                           - Await pathology results.                           - Repeat colonoscopy in 3 years for surveillance. Procedure Code(s):        --- Professional ---                           [phone], Colonoscopy, flexible; with removal of                            tumor(s), polyp(s), or other lesion(s) by snare                            technique Diagnosis Code(s):        --- Professional ---                           Z12.11, Encounter for screening for malignant                            neoplasm of colon                            D12.8, Benign neoplasm of rectum                           D12.3, Benign neoplasm of transverse colon (hepatic                            flexure or splenic flexure)  D12.2, Benign neoplasm of ascending colon                           D12.5, Benign neoplasm of sigmoid colon CPT copyright 2022 [employer]. All rights reserved. The codes documented in this report are preliminary and upon coder review may  be revised to meet current compliance requirements. Carol Ada, MD Carol Ada, MD 08/17/2022 1:42:19 PM This report has been signed electronically. Number of Addenda: 0

## 2022-08-18 ENCOUNTER — Encounter (HOSPITAL_COMMUNITY): Payer: Self-pay | Admitting: Gastroenterology

## 2022-08-18 NOTE — Anesthesia Postprocedure Evaluation (Signed)
Anesthesia Post Note  Patient: [name]  Procedure(s) Performed: COLONOSCOPY WITH PROPOFOL POLYPECTOMY HEMOSTASIS CLIP PLACEMENT     Patient location during evaluation: Endoscopy Anesthesia Type: MAC Level of consciousness: oriented, awake and alert and awake Pain management: pain level controlled Vital Signs Assessment: post-procedure vital signs reviewed and stable Respiratory status: spontaneous breathing, nonlabored ventilation, respiratory function stable and patient connected to nasal cannula oxygen Cardiovascular status: blood pressure returned to baseline and stable Postop Assessment: no headache, no backache and no apparent nausea or vomiting Anesthetic complications: no   No notable events documented.  Last Vitals:  Vitals:   08/17/22 1350 08/17/22 1400  BP: (!) 142/98 (!) 153/100  Pulse: 80 77  Resp: 18 18  Temp:    SpO2: 97% 99%    Last Pain:  Vitals:   08/17/22 1340  TempSrc: Oral  PainSc:                  Santa Lighter

## 2022-08-23 LAB — SURGICAL PATHOLOGY

## 2022-08-23 NOTE — Progress Notes (Signed)
Chief Complaint:   OBESITY [name] is here to discuss her progress with her obesity treatment plan along with follow-up of her obesity related diagnoses. [name] is on the Category 3 Plan and states she is following her eating plan approximately 85% of the time. [name] states she is stretching/exercising 30-40 minutes 3 times per week.  Today's visit was #: 74 Starting weight: 375 lbs Starting date: 03/17/2020 Today's weight: 352 lbs Today's date: 08/04/2022 Total lbs lost to date: 23 lbs Total lbs lost since last in-office visit: 7  Interim History: [name] has been very busy with following up with doctors.  She has been drinking protein water in the a.m. to get protein in without chewing.  She is doing quite a bit of stretching in the a.m.  She is having a colonoscopy January 2.  Subjective:   1. Prediabetes [name] is on Ozempic 0.5 mg subcutaneous.  No GI side effects in good control of satiety.  2. Vitamin D deficiency [name] is currently taking prescription Vit D 50,000 IU once a week. Denies any nausea, vomiting or muscle weakness. She notes fatigue.  3. Depression with anxiety [name] is on Sertraline with good control of symptoms.  Assessment/Plan:   1. Prediabetes We will refill Ozempic 0.5 mg subcu once weekly for 1 month with 0 refills.  -Refill Semaglutide,0.25 or 0.'5MG'$ /DOS, (OZEMPIC, 0.25 OR 0.5 MG/DOSE,) 2 MG/1.5ML SOPN; Inject 0.5 mg into the skin once a week.  Dispense: 3 mL; Refill: 0  2. Vitamin D deficiency We will refill Vit D 50K IU once a week for 1 month with 0 refills.  -Refill Vitamin D, Ergocalciferol, (DRISDOL) 1.25 MG (50000 UNIT) CAPS capsule; Take 1 capsule (50,000 Units total) by mouth every 7 (seven) days.  Dispense: 4 capsule; Refill: 0  3. Depression with anxiety We will refill Zoloft 50 mg by mouth daily for 3 months with 0 refills.  -Refill sertraline (ZOLOFT) 50 MG tablet; Take 1 tablet (50 mg total) by mouth daily.  Dispense: 90  tablet; Refill: 0  4. Obesity with current BMI of 55.2 [name] is currently in the action stage of change. As such, her goal is to continue with weight loss efforts. She has agreed to the Category 3 Plan.   Exercise goals: All adults should avoid inactivity. Some physical activity is better than none, and adults who participate in any amount of physical activity gain some health benefits.  Behavioral modification strategies: increasing lean protein intake, meal planning and cooking strategies, keeping healthy foods in the home, and avoiding temptations.  [name] has agreed to follow-up with our clinic in 4 weeks. She was informed of the importance of frequent follow-up visits to maximize her success with intensive lifestyle modifications for her multiple health conditions.   Objective:   Blood pressure 134/85, pulse 86, temperature 98.5 F (36.9 C), height '5\' 7"'$  (1.702 m), weight (!) 352 lb (159.7 kg), SpO2 97 %. Body mass index is 55.13 kg/m.  General: Cooperative, alert, well developed, in no acute distress. HEENT: Conjunctivae and lids unremarkable. Cardiovascular: Regular rhythm.  Lungs: Normal work of breathing. Neurologic: No focal deficits.   Lab Results  Component Value Date   CREATININE 0.87 03/17/2020   BUN 12 03/17/2020   NA 138 03/17/2020   K 4.2 03/17/2020   CL 102 03/17/2020   CO2 21 03/17/2020   Lab Results  Component Value Date   ALT 13 03/17/2020   AST 19 03/17/2020   ALKPHOS 54 03/17/2020   BILITOT  0.3 03/17/2020   Lab Results  Component Value Date   HGBA1C 5.7 (H) 03/17/2020   Lab Results  Component Value Date   INSULIN 37.9 (H) 03/17/2020   No results found for: "TSH" No results found for: "CHOL", "HDL", "LDLCALC", "LDLDIRECT", "TRIG", "CHOLHDL" Lab Results  Component Value Date   VD25OH 11.8 (L) 03/17/2020   No results found for: "WBC", "HGB", "HCT", "MCV", "PLT" No results found for: "IRON", "TIBC", "FERRITIN"  Attestation Statements:    Reviewed by clinician on day of visit: allergies, medications, problem list, medical history, surgical history, family history, social history, and previous encounter notes.  I, Elnora Morrison, RMA am acting as transcriptionist for Coralie Common, MD.  I have reviewed the above documentation for accuracy and completeness, and I agree with the above. - Coralie Common, MD

## 2022-08-27 ENCOUNTER — Other Ambulatory Visit (INDEPENDENT_AMBULATORY_CARE_PROVIDER_SITE_OTHER): Payer: Self-pay | Admitting: Family Medicine

## 2022-08-27 DIAGNOSIS — E559 Vitamin D deficiency, unspecified: Secondary | ICD-10-CM

## 2022-08-31 ENCOUNTER — Other Ambulatory Visit (INDEPENDENT_AMBULATORY_CARE_PROVIDER_SITE_OTHER): Payer: Self-pay | Admitting: Family Medicine

## 2022-08-31 DIAGNOSIS — I10 Essential (primary) hypertension: Secondary | ICD-10-CM

## 2022-09-02 ENCOUNTER — Encounter (INDEPENDENT_AMBULATORY_CARE_PROVIDER_SITE_OTHER): Payer: Self-pay | Admitting: Family Medicine

## 2022-09-02 ENCOUNTER — Ambulatory Visit (INDEPENDENT_AMBULATORY_CARE_PROVIDER_SITE_OTHER): Payer: BC Managed Care – PPO | Admitting: Family Medicine

## 2022-09-02 VITALS — BP 138/85 | HR 85 | Temp 98.5°F | Ht 67.0 in

## 2022-09-02 DIAGNOSIS — E559 Vitamin D deficiency, unspecified: Secondary | ICD-10-CM | POA: Diagnosis not present

## 2022-09-02 DIAGNOSIS — E669 Obesity, unspecified: Secondary | ICD-10-CM

## 2022-09-02 DIAGNOSIS — I10 Essential (primary) hypertension: Secondary | ICD-10-CM

## 2022-09-02 DIAGNOSIS — R7303 Prediabetes: Secondary | ICD-10-CM | POA: Diagnosis not present

## 2022-09-02 DIAGNOSIS — Z6841 Body Mass Index (BMI) 40.0 and over, adult: Secondary | ICD-10-CM

## 2022-09-02 MED ORDER — VITAMIN D (ERGOCALCIFEROL) 1.25 MG (50000 UNIT) PO CAPS: 50000.0000 [IU] | ORAL_CAPSULE | ORAL | 0 refills | Status: DC

## 2022-09-02 MED ORDER — OZEMPIC (0.25 OR 0.5 MG/DOSE) 2 MG/1.5ML ~~LOC~~ SOPN: 0.5000 mg | PEN_INJECTOR | SUBCUTANEOUS | 0 refills | Status: DC

## 2022-09-02 MED ORDER — CHLORTHALIDONE 25 MG PO TABS: 12.5000 mg | ORAL_TABLET | Freq: Every day | ORAL | 0 refills | Status: DC

## 2022-09-14 NOTE — Progress Notes (Signed)
Chief Complaint:   OBESITY [name] is here to discuss her progress with her obesity treatment plan along with follow-up of her obesity related diagnoses. [name] is on the Category 3 Plan and states she is following her eating plan approximately 80-85% of the time. [name] states she is swin=mming/yoga/bed pilates 30-45 minutes 2-3 times per week.  Today's visit was #: 30 Starting weight: 375 lbs Starting date: 03/17/2020 Today's weight: 356 lbs Today's date: 09/02/2022 Total lbs lost to date: 19 lbs Total lbs lost since last in-office visit: 0  Interim History: [name] had a very peaceful holiday with her son and stayed home.  Really enjoying work for the most part.  Headaches are improving.  She realizes she does not like eating the same food 5 days in a row.  She is still meal prepping.  Subjective:   1. Essential hypertension [name] blood pressure well-controlled today.  Denies chest pain, chest pressure and headache.  2. Prediabetes [name] is on Ozempic 0.5 mg subcu once a week.  Last A1c at 5.7.  3. Vitamin D deficiency [name] is currently taking prescription Vit D 50,000 IU once a week.  Denies any nausea, vomiting or muscle weakness.  She notes fatigue.  Assessment/Plan:   1. Essential hypertension Will refill Chlorthalidone 12.5 mg daily for 3 month s with 0 refills.  -Refill chlorthalidone (HYGROTON) 25 MG tablet; Take 0.5 tablets (12.5 mg total) by mouth daily.  Dispense: 90 tablet; Refill: 0  2. Prediabetes Will refill Ozempic 0.5 mg SubQ once weekly for 1 month with 0 refills.  -Refill Semaglutide,0.25 or 0.'5MG'$ /DOS, (OZEMPIC, 0.25 OR 0.5 MG/DOSE,) 2 MG/1.5ML SOPN; Inject 0.5 mg into the skin once a week.  Dispense: 3 mL; Refill: 0  3. Vitamin D deficiency We will refill Vit D 50K IU once a week for 1 month with 0 refills.  -Refill Vitamin D, Ergocalciferol, (DRISDOL) 1.25 MG (50000 UNIT) CAPS capsule; Take 1 capsule (50,000 Units total) by mouth every 7 (seven)  days.  Dispense: 4 capsule; Refill: 0  4. Obesity with current BMI of 55.8 [name] is currently in the action stage of change. As such, her goal is to continue with weight loss efforts. She has agreed to the Category 3 Plan.   Exercise goals: As is.  Behavioral modification strategies: increasing lean protein intake, meal planning and cooking strategies, keeping healthy foods in the home, and planning for success.  [name] has agreed to follow-up with our clinic in 4 weeks. She was informed of the importance of frequent follow-up visits to maximize her success with intensive lifestyle modifications for her multiple health conditions.   Objective:   Blood pressure 138/85, pulse 85, temperature 98.5 F (36.9 C), height '5\' 7"'$  (1.702 m), SpO2 97 %. Body mass index is 55.14 kg/m.  General: Cooperative, alert, well developed, in no acute distress. HEENT: Conjunctivae and lids unremarkable. Cardiovascular: Regular rhythm.  Lungs: Normal work of breathing. Neurologic: No focal deficits.   Lab Results  Component Value Date   CREATININE 0.87 03/17/2020   BUN 12 03/17/2020   NA 138 03/17/2020   K 4.2 03/17/2020   CL 102 03/17/2020   CO2 21 03/17/2020   Lab Results  Component Value Date   ALT 13 03/17/2020   AST 19 03/17/2020   ALKPHOS 54 03/17/2020   BILITOT 0.3 03/17/2020   Lab Results  Component Value Date   HGBA1C 5.7 (H) 03/17/2020   Lab Results  Component Value Date   INSULIN 37.9 (H)  03/17/2020   No results found for: "TSH" No results found for: "CHOL", "HDL", "LDLCALC", "LDLDIRECT", "TRIG", "CHOLHDL" Lab Results  Component Value Date   VD25OH 11.8 (L) 03/17/2020   No results found for: "WBC", "HGB", "HCT", "MCV", "PLT" No results found for: "IRON", "TIBC", "FERRITIN"  Attestation Statements:   Reviewed by clinician on day of visit: allergies, medications, problem list, medical history, surgical history, family history, social history, and previous encounter  notes.  I, Elnora Morrison, RMA am acting as transcriptionist for Coralie Common, MD.  I have reviewed the above documentation for accuracy and completeness, and I agree with the above. - Coralie Common, MD

## 2022-09-23 ENCOUNTER — Encounter (INDEPENDENT_AMBULATORY_CARE_PROVIDER_SITE_OTHER): Payer: Self-pay | Admitting: Family Medicine

## 2022-09-23 ENCOUNTER — Ambulatory Visit (INDEPENDENT_AMBULATORY_CARE_PROVIDER_SITE_OTHER): Payer: BC Managed Care – PPO | Admitting: Family Medicine

## 2022-09-23 VITALS — BP 137/84 | HR 87 | Temp 98.1°F | Ht 67.0 in | Wt 358.0 lb

## 2022-09-23 DIAGNOSIS — E559 Vitamin D deficiency, unspecified: Secondary | ICD-10-CM

## 2022-09-23 DIAGNOSIS — E669 Obesity, unspecified: Secondary | ICD-10-CM

## 2022-09-23 DIAGNOSIS — Z6841 Body Mass Index (BMI) 40.0 and over, adult: Secondary | ICD-10-CM

## 2022-09-23 DIAGNOSIS — R7303 Prediabetes: Secondary | ICD-10-CM | POA: Diagnosis not present

## 2022-09-23 MED ORDER — VITAMIN D (ERGOCALCIFEROL) 1.25 MG (50000 UNIT) PO CAPS: 50000.0000 [IU] | ORAL_CAPSULE | ORAL | 0 refills | Status: DC

## 2022-09-23 MED ORDER — OZEMPIC (0.25 OR 0.5 MG/DOSE) 2 MG/1.5ML ~~LOC~~ SOPN: 0.5000 mg | PEN_INJECTOR | SUBCUTANEOUS | 0 refills | Status: DC

## 2022-09-23 NOTE — Progress Notes (Deleted)
Patient is feeling kind of down this week- she has an URI and lost her voice but just feels run down.  She has a lot of events, appointments, attitudes in the last few weeks.  Her [birth year] and she may have plans to go to dinner with her friends. Once her son gets thru his auditions for the spring she is hopeful to get some time.  She is still working out and is enjoying it. Trying to create a positive relationship with getting to workout.  Patient feels very upset about the cessation about ozempic.

## 2022-10-07 NOTE — Progress Notes (Signed)
Chief Complaint:   OBESITY [name] is here to discuss her progress with her obesity treatment plan along with follow-up of her obesity related diagnoses. [name] is on the Category 3 Plan and states she is following her eating plan approximately 85-86% of the time. [name] states she is swimming/stretching 45-60 minutes 3 times per week.  Today's visit was #: 25 Starting weight: 375 lbs Starting date: 03/17/2020 Today's weight: 358 lbs Today's date: 09/23/2022 Total lbs lost to date: 17 lbs Total lbs lost since last in-office visit: 0  Interim History: [name] is feeling kind of down this week- she has an URI and lost her voice but just feels run down.  She has a lot of events, appointments, attitudes in the last few weeks.  Her [birth year] is [birth year] and she may have plans to go to dinner with her friends. Once her son gets thru his auditions for the spring she is hopeful to get some time.  She is still working out and is enjoying it. Trying to create a positive relationship with getting to workout.  Patient feels very upset about the cessation about ozempic.  Subjective:   1. Vitamin D deficiency [name] is currently taking prescription Vit D 50,000 IU once a week.  Denies any nausea, vomiting or muscle weakness.  She notes fatigue.  2. Prediabetes [name] is on Ozempic once weekly.  Good satiety.  A1c still at 5.7.  Assessment/Plan:   1. Vitamin D deficiency We will refill Vit D 50K IU once a week for 1 month with 0 refills.  -Refill Vitamin D, Ergocalciferol, (DRISDOL) 1.25 MG (50000 UNIT) CAPS capsule; Take 1 capsule (50,000 Units total) by mouth every 7 (seven) days.  Dispense: 4 capsule; Refill: 0  2. Prediabetes Will refill Ozempic 0.5 mg subcu once weekly for 1 month with 0 refills.  -Refill Semaglutide,0.25 or 0.'5MG'$ /DOS, (OZEMPIC, 0.25 OR 0.5 MG/DOSE,) 2 MG/1.5ML SOPN; Inject 0.5 mg into the skin once a week.  Dispense: 9 mL; Refill: 0  3. BMI 50.0-59.9, adult ([name])  4.  Obesity with starting BMI of 58.7 Sema is currently in the action stage of change. As such, her goal is to continue with weight loss efforts. She has agreed to the Category 3 Plan.   Exercise goals: As is.    Behavioral modification strategies: increasing lean protein intake, increasing vegetables, meal planning and cooking strategies, keeping healthy foods in the home, and planning for success.  [name] has agreed to follow-up with our clinic in 4 weeks. She was informed of the importance of frequent follow-up visits to maximize her success with intensive lifestyle modifications for her multiple health conditions.   Objective:   Blood pressure 137/84, pulse 87, temperature 98.1 F (36.7 C), height '5\' 7"'$  (1.702 m), weight (!) 358 lb (162.4 kg), SpO2 97 %. Body mass index is 56.07 kg/m.  General: Cooperative, alert, well developed, in no acute distress. HEENT: Conjunctivae and lids unremarkable. Cardiovascular: Regular rhythm.  Lungs: Normal work of breathing. Neurologic: No focal deficits.   Lab Results  Component Value Date   CREATININE 0.87 03/17/2020   BUN 12 03/17/2020   NA 138 03/17/2020   K 4.2 03/17/2020   CL 102 03/17/2020   CO2 21 03/17/2020   Lab Results  Component Value Date   ALT 13 03/17/2020   AST 19 03/17/2020   ALKPHOS 54 03/17/2020   BILITOT 0.3 03/17/2020   Lab Results  Component Value Date   HGBA1C 5.7 (H) 03/17/2020  Lab Results  Component Value Date   INSULIN 37.9 (H) 03/17/2020   No results found for: "TSH" No results found for: "CHOL", "HDL", "LDLCALC", "LDLDIRECT", "TRIG", "CHOLHDL" Lab Results  Component Value Date   VD25OH 11.8 (L) 03/17/2020   No results found for: "WBC", "HGB", "HCT", "MCV", "PLT" No results found for: "IRON", "TIBC", "FERRITIN"  Attestation Statements:   Reviewed by clinician on day of visit: allergies, medications, problem list, medical history, surgical history, family history, social history, and previous  encounter notes.  I, Elnora Morrison, RMA am acting as transcriptionist for Coralie Common, MD.  I have reviewed the above documentation for accuracy and completeness, and I agree with the above. - Coralie Common, MD

## 2022-10-19 ENCOUNTER — Other Ambulatory Visit (INDEPENDENT_AMBULATORY_CARE_PROVIDER_SITE_OTHER): Payer: Self-pay | Admitting: Family Medicine

## 2022-10-19 DIAGNOSIS — E559 Vitamin D deficiency, unspecified: Secondary | ICD-10-CM

## 2022-10-21 ENCOUNTER — Other Ambulatory Visit (INDEPENDENT_AMBULATORY_CARE_PROVIDER_SITE_OTHER): Payer: Self-pay | Admitting: Family Medicine

## 2022-10-21 DIAGNOSIS — F418 Other specified anxiety disorders: Secondary | ICD-10-CM

## 2022-10-28 ENCOUNTER — Ambulatory Visit (INDEPENDENT_AMBULATORY_CARE_PROVIDER_SITE_OTHER): Payer: BC Managed Care – PPO | Admitting: Family Medicine

## 2022-11-03 ENCOUNTER — Encounter (INDEPENDENT_AMBULATORY_CARE_PROVIDER_SITE_OTHER): Payer: Self-pay | Admitting: Family Medicine

## 2022-11-03 ENCOUNTER — Ambulatory Visit (INDEPENDENT_AMBULATORY_CARE_PROVIDER_SITE_OTHER): Payer: BC Managed Care – PPO | Admitting: Family Medicine

## 2022-11-03 VITALS — BP 135/83 | HR 91 | Temp 98.2°F | Ht 67.0 in | Wt 363.0 lb

## 2022-11-03 DIAGNOSIS — F418 Other specified anxiety disorders: Secondary | ICD-10-CM | POA: Diagnosis not present

## 2022-11-03 DIAGNOSIS — Z6841 Body Mass Index (BMI) 40.0 and over, adult: Secondary | ICD-10-CM | POA: Diagnosis not present

## 2022-11-03 DIAGNOSIS — E669 Obesity, unspecified: Secondary | ICD-10-CM

## 2022-11-03 DIAGNOSIS — E559 Vitamin D deficiency, unspecified: Secondary | ICD-10-CM | POA: Diagnosis not present

## 2022-11-03 MED ORDER — VITAMIN D (ERGOCALCIFEROL) 1.25 MG (50000 UNIT) PO CAPS: 50000.0000 [IU] | ORAL_CAPSULE | ORAL | 0 refills | Status: DC

## 2022-11-03 MED ORDER — SERTRALINE HCL 50 MG PO TABS: 50.0000 mg | ORAL_TABLET | Freq: Every day | ORAL | 1 refills | Status: DC

## 2022-11-03 NOTE — Progress Notes (Signed)
Chief Complaint:   OBESITY [name] is here to discuss her progress with her obesity treatment plan along with follow-up of her obesity related diagnoses. [name] is on the Category 3 Plan and states she is following her eating plan approximately 50% of the time. [name] states she is swimming 60 minutes 2-3 times per week.  Today's visit was #: 59 Starting weight: 375 lbs Starting date: 03/17/2020 Today's weight: 363 lbs Today's date: 11/03/2022 Total lbs lost to date: 12 lbs Total lbs lost since last in-office visit: +5 lbs  Interim History: Patient starting spring break in 2 days.  She has been following meal plan around 50% due to work demands and stress.  She hasn't had the Ozempic as it wasn't filled at last appointment.  Patient was initially planning on going to the beach for break but due to events she will do a day trip somewhere else. She voices concern for diabetes due to increased thirst.  She has never felt this amount of thirst before.  Her son has been eating some of her food making it harder for her to stick to plan.  This week she has done better in terms of getting more on track.  Recognizes she will be stressed until June due to the event she is playing.   Subjective:   1. Vitamin D deficiency Patient is on Sertraline with good control of symptoms.   2. Depression with anxiety Patient is on prescription Vitamin D, takes weekly. Patient denies nausea, vomiting, muscle weakness, but is positive for fatigue.   Assessment/Plan:   1. Vitamin D deficiency Refill - Vitamin D, Ergocalciferol, (DRISDOL) 1.25 MG (50000 UNIT) CAPS capsule; Take 1 capsule (50,000 Units total) by mouth every 7 (seven) days.  Dispense: 4 capsule; Refill: 0  2. Depression with anxiety Refill - sertraline (ZOLOFT) 50 MG tablet; Take 1 tablet (50 mg total) by mouth daily.  Dispense: 90 tablet; Refill: 1  3. BMI 50.0-59.9, adult ([place])  4. Obesity with starting BMI of 58.7 [name] is currently  in the action stage of change. As such, her goal is to continue with weight loss efforts. She has agreed to the Category 3 Plan.   Exercise goals: All adults should avoid inactivity. Some physical activity is better than none, and adults who participate in any amount of physical activity gain some health benefits.  Behavioral modification strategies: increasing lean protein intake, meal planning and cooking strategies, keeping healthy foods in the home, and planning for success.  [name] has agreed to follow-up with our clinic in 5 weeks. She was informed of the importance of frequent follow-up visits to maximize her success with intensive lifestyle modifications for her multiple health conditions.   Objective:   Blood pressure 135/83, pulse 91, temperature 98.2 F (36.8 C), height 5\' 7"  (1.702 m), weight (!) 363 lb (164.7 kg), SpO2 97 %. Body mass index is 56.85 kg/m.  General: Cooperative, alert, well developed, in no acute distress. HEENT: Conjunctivae and lids unremarkable. Cardiovascular: Regular rhythm.  Lungs: Normal work of breathing. Neurologic: No focal deficits.   Lab Results  Component Value Date   CREATININE 0.87 03/17/2020   BUN 12 03/17/2020   NA 138 03/17/2020   K 4.2 03/17/2020   CL 102 03/17/2020   CO2 21 03/17/2020   Lab Results  Component Value Date   ALT 13 03/17/2020   AST 19 03/17/2020   ALKPHOS 54 03/17/2020   BILITOT 0.3 03/17/2020   Lab Results  Component Value  Date   HGBA1C 5.7 (H) 03/17/2020   Lab Results  Component Value Date   INSULIN 37.9 (H) 03/17/2020   No results found for: "TSH" No results found for: "CHOL", "HDL", "LDLCALC", "LDLDIRECT", "TRIG", "CHOLHDL" Lab Results  Component Value Date   VD25OH 11.8 (L) 03/17/2020   No results found for: "WBC", "HGB", "HCT", "MCV", "PLT" No results found for: "IRON", "TIBC", "FERRITIN"  Attestation Statements:   Reviewed by clinician on day of visit: allergies, medications, problem list,  medical history, surgical history, family history, social history, and previous encounter notes.  I, Davy Pique, RMA, am acting as transcriptionist for Coralie Common, MD.  I have reviewed the above documentation for accuracy and completeness, and I agree with the above. - Coralie Common, MD

## 2022-11-30 ENCOUNTER — Other Ambulatory Visit (INDEPENDENT_AMBULATORY_CARE_PROVIDER_SITE_OTHER): Payer: Self-pay | Admitting: Family Medicine

## 2022-11-30 DIAGNOSIS — E559 Vitamin D deficiency, unspecified: Secondary | ICD-10-CM

## 2022-12-15 ENCOUNTER — Ambulatory Visit (INDEPENDENT_AMBULATORY_CARE_PROVIDER_SITE_OTHER): Payer: BC Managed Care – PPO | Admitting: Family Medicine

## 2022-12-15 ENCOUNTER — Encounter (INDEPENDENT_AMBULATORY_CARE_PROVIDER_SITE_OTHER): Payer: Self-pay | Admitting: Family Medicine

## 2022-12-15 VITALS — BP 174/88 | HR 94 | Temp 98.5°F | Ht 67.0 in | Wt 367.0 lb

## 2022-12-15 DIAGNOSIS — E559 Vitamin D deficiency, unspecified: Secondary | ICD-10-CM | POA: Diagnosis not present

## 2022-12-15 DIAGNOSIS — I10 Essential (primary) hypertension: Secondary | ICD-10-CM | POA: Diagnosis not present

## 2022-12-15 DIAGNOSIS — Z6841 Body Mass Index (BMI) 40.0 and over, adult: Secondary | ICD-10-CM

## 2022-12-15 DIAGNOSIS — E669 Obesity, unspecified: Secondary | ICD-10-CM | POA: Diagnosis not present

## 2022-12-15 MED ORDER — VITAMIN D (ERGOCALCIFEROL) 1.25 MG (50000 UNIT) PO CAPS: 50000.0000 [IU] | ORAL_CAPSULE | ORAL | 0 refills | Status: DC

## 2022-12-15 NOTE — Progress Notes (Signed)
Chief Complaint:   OBESITY [name] is here to discuss her progress with her obesity treatment plan along with follow-up of her obesity related diagnoses. [name] is on the Category 3 Plan and states she is following her eating plan approximately 80% of the time. [name] states she is not exercising.   Today's visit was #: 33 Starting weight: 375 lbs Starting date: 03/17/2020 Today's weight: 367 lbs Today's date: 12/15/2022 Total lbs lost to date: 8 lbs Total lbs lost since last in-office visit: +4 lbs  Interim History: Patient has been ill for the last few weeks- sinus infection, URI and now laryngitis.  She is very fatigued and voices she really needs rest.  She is taking tomorrow off to give herself some respite.  Has been doing breakfast on meal plan and trying to stick to ready made meals.  Hasn't been to exercise due to illness.  She acknowledges that she didn't really rest when she first got sick.  Hasn't been eating much particularly for lunch given her illness and her post nasal drip.  Has done really well with her waer intake.   Subjective:   1. Vitamin D deficiency Patient is on prescription Vitamin D.  Patient denies nausea, vomiting, muscle weakness, but is positive for fatigue.  2. Essential hypertension Patient blood pressure elevated today.  Patient denies chest pain, chest pressure, headache.  Patient reports that she is not feeling well today and got into an argument with her son.  Assessment/Plan:   1. Vitamin D deficiency Refill- Vitamin D, Ergocalciferol, (DRISDOL) 1.25 MG (50000 UNIT) CAPS capsule; Take 1 capsule (50,000 Units total) by mouth every 7 (seven) days.  Dispense: 4 capsule; Refill: 0  2. Essential hypertension Continue current medications.  Repeat labs in September 2024.  3. BMI 50.0-59.9, adult ([facility])  4. Obesity with starting BMI of 58.7 Gavriella is currently in the action stage of change. As such, her goal is to continue with weight loss  efforts. She has agreed to the Category 3 Plan.   Exercise goals: No exercise has been prescribed at this time.  Behavioral modification strategies: increasing lean protein intake, meal planning and cooking strategies, keeping healthy foods in the home, and planning for success.  Sherran has agreed to follow-up with our clinic in 4-5 weeks. She was informed of the importance of frequent follow-up visits to maximize her success with intensive lifestyle modifications for her multiple health conditions.   Objective:   Blood pressure (!) 174/88, pulse 94, temperature 98.5 F (36.9 C), height 5\' 7"  (1.702 m), weight (!) 367 lb (166.5 kg), SpO2 96 %. Body mass index is 57.48 kg/m.  General: Cooperative, alert, well developed, in no acute distress. HEENT: Conjunctivae and lids unremarkable. Cardiovascular: Regular rhythm.  Lungs: Normal work of breathing. Neurologic: No focal deficits.   Lab Results  Component Value Date   CREATININE 0.87 03/17/2020   BUN 12 03/17/2020   NA 138 03/17/2020   K 4.2 03/17/2020   CL 102 03/17/2020   CO2 21 03/17/2020   Lab Results  Component Value Date   ALT 13 03/17/2020   AST 19 03/17/2020   ALKPHOS 54 03/17/2020   BILITOT 0.3 03/17/2020   Lab Results  Component Value Date   HGBA1C 5.7 (H) 03/17/2020   Lab Results  Component Value Date   INSULIN 37.9 (H) 03/17/2020   No results found for: "TSH" No results found for: "CHOL", "HDL", "LDLCALC", "LDLDIRECT", "TRIG", "CHOLHDL" Lab Results  Component Value Date  VD25OH 11.8 (L) 03/17/2020   No results found for: "WBC", "HGB", "HCT", "MCV", "PLT" No results found for: "IRON", "TIBC", "FERRITIN"  Attestation Statements:   Reviewed by clinician on day of visit: allergies, medications, problem list, medical history, surgical history, family history, social history, and previous encounter notes.  I, Malcolm Metro, RMA, am acting as transcriptionist for Reuben Likes, MD.  I have reviewed  the above documentation for accuracy and completeness, and I agree with the above. - Reuben Likes, MD

## 2023-01-12 ENCOUNTER — Encounter (INDEPENDENT_AMBULATORY_CARE_PROVIDER_SITE_OTHER): Payer: Self-pay | Admitting: Family Medicine

## 2023-01-12 ENCOUNTER — Telehealth (INDEPENDENT_AMBULATORY_CARE_PROVIDER_SITE_OTHER): Payer: BC Managed Care – PPO | Admitting: Family Medicine

## 2023-01-12 VITALS — Ht 67.0 in | Wt 363.0 lb

## 2023-01-12 DIAGNOSIS — F418 Other specified anxiety disorders: Secondary | ICD-10-CM | POA: Diagnosis not present

## 2023-01-12 DIAGNOSIS — E559 Vitamin D deficiency, unspecified: Secondary | ICD-10-CM | POA: Diagnosis not present

## 2023-01-12 DIAGNOSIS — Z6841 Body Mass Index (BMI) 40.0 and over, adult: Secondary | ICD-10-CM

## 2023-01-12 MED ORDER — VITAMIN D (ERGOCALCIFEROL) 1.25 MG (50000 UNIT) PO CAPS: 50000.0000 [IU] | ORAL_CAPSULE | ORAL | 0 refills | Status: DC

## 2023-01-12 MED ORDER — SERTRALINE HCL 50 MG PO TABS: 50.0000 mg | ORAL_TABLET | Freq: Every day | ORAL | 1 refills | Status: AC

## 2023-01-12 NOTE — Progress Notes (Signed)
TeleHealth Visit:  This visit was completed with telemedicine (audio/video) technology. [name] has verbally consented to this TeleHealth visit. The patient is located at home, the provider is located at home. The participants in this visit include the listed provider and patient. The visit was conducted today via MyChart video.  OBESITY [name] is here to discuss her progress with her obesity treatment plan along with follow-up of her obesity related diagnoses.   Starting weight: 375 lbs Starting date: 03/17/2020 Weight in office:  363 lbs on 01/12/23 Total weight loss: 12  lbs  Weight loss since last OV: 14  Nutrition Plan: the Category 3 plan   Current exercise:  stretching in am, chair yoga at night 3 times per week  Interim History:  She is doing a great job with water intake- takes her travel cup everywhere.  She is on plan at breakfast and lunch. She is doing better with night snacking- not eating after 7:30 at night. She finds that greasy food makes her sick. She cooks dinner at home. Meal preps for the week on Sunday. She wants to look at food as fuel and feels that a lot of her mindset has changed. Only notes hunger and cravings when not following meal plan. No intake of sugar sweetened beverages.   Patient states that Dr. Marquis Lunch has been wanting to do a repeat IC. Assessment/Plan:  1. Depression and anxiety She notes the sertraline really helps with mood. She has forgotten it a few times and really noticed.   PLan: Refill Zoloft 50 mg weekly.  2. Vitamin D Deficiency Vitamin D is not at goal of 50.  Most recent vitamin D level was very low at 11.8 on 03/17/2020.Marland Kitchen She is on  prescription ergocalciferol 50,000 IU weekly. Lab Results  Component Value Date   VD25OH 11.8 (L) 03/17/2020    Plan: Continue and refill  prescription ergocalciferol 50,000 IU weekly Check labs next visit.  3. Morbid Obesity: Current BMI 56  [name] is currently in the action stage of  change. As such, her goal is to continue with weight loss efforts.  She has agreed to the Category 3 plan.  Exercise goals:  as is  Behavioral modification strategies: increasing lean protein intake, decreasing simple carbohydrates , meal planning , and planning for success.  [name] has agreed to follow-up with our clinic in 6 weeks with fasting labs and IC. Patient aware that she needs to be fasting and arrive at 10:10 for her 10:40 a.m. appointment.  No orders of the defined types were placed in this encounter.   Medications Discontinued During This Encounter  Medication Reason   sertraline (ZOLOFT) 50 MG tablet Reorder   Vitamin D, Ergocalciferol, (DRISDOL) 1.25 MG (50000 UNIT) CAPS capsule Reorder     Meds ordered this encounter  Medications   Vitamin D, Ergocalciferol, (DRISDOL) 1.25 MG (50000 UNIT) CAPS capsule    Sig: Take 1 capsule (50,000 Units total) by mouth every 7 (seven) days.    Dispense:  4 capsule    Refill:  0    Order Specific Question:   Supervising Provider    Answer:   Glennis Brink [[id number]]   sertraline (ZOLOFT) 50 MG tablet    Sig: Take 1 tablet (50 mg total) by mouth daily.    Dispense:  90 tablet    Refill:  1    Order Specific Question:   Supervising Provider    Answer:   Glennis Brink [[id number]]      Objective:  VITALS: Per patient if applicable, see vitals. GENERAL: Alert and in no acute distress. CARDIOPULMONARY: No increased WOB. Speaking in clear sentences.  PSYCH: Pleasant and cooperative. Speech normal rate and rhythm. Affect is appropriate. Insight and judgement are appropriate. Attention is focused, linear, and appropriate.  NEURO: Oriented as arrived to appointment on time with no prompting.   Attestation Statements:   Reviewed by clinician on day of visit: allergies, medications, problem list, medical history, surgical history, family history, social history, and previous encounter notes.   This was prepared with the assistance of  Engineer, civil (consulting).  Occasional wrong-word or sound-a-like substitutions may have occurred due to the inherent limitations of voice recognition software.

## 2023-01-31 ENCOUNTER — Other Ambulatory Visit (INDEPENDENT_AMBULATORY_CARE_PROVIDER_SITE_OTHER): Payer: Self-pay | Admitting: Family Medicine

## 2023-01-31 DIAGNOSIS — I10 Essential (primary) hypertension: Secondary | ICD-10-CM

## 2023-02-18 ENCOUNTER — Other Ambulatory Visit (INDEPENDENT_AMBULATORY_CARE_PROVIDER_SITE_OTHER): Payer: Self-pay | Admitting: Family Medicine

## 2023-02-18 DIAGNOSIS — E559 Vitamin D deficiency, unspecified: Secondary | ICD-10-CM

## 2023-02-23 ENCOUNTER — Ambulatory Visit (INDEPENDENT_AMBULATORY_CARE_PROVIDER_SITE_OTHER): Payer: BC Managed Care – PPO | Admitting: Family Medicine

## 2023-03-07 LAB — HEPATIC FUNCTION PANEL
ALT: 28 U/L (ref 7–35)
AST: 30 (ref 13–35)
Alkaline Phosphatase: 61 (ref 25–125)
Bilirubin, Total: 0.3

## 2023-03-07 LAB — HEMOGLOBIN A1C: Hemoglobin A1C: 6.2

## 2023-03-07 LAB — BASIC METABOLIC PANEL
BUN: 12 (ref 4–21)
CO2: 26 — AB (ref 13–22)
Chloride: 100 (ref 99–108)
Creatinine: 0.7 (ref 0.5–1.1)
Glucose: 111
Potassium: 4.7 mEq/L (ref 3.5–5.1)
Sodium: 137 (ref 137–147)

## 2023-03-07 LAB — LIPID PANEL
Cholesterol: 172 (ref 0–200)
HDL: 48 (ref 35–70)
LDL Cholesterol: 88
Triglycerides: 213 — AB (ref 40–160)

## 2023-03-07 LAB — CBC AND DIFFERENTIAL
HCT: 43 (ref 36–46)
Hemoglobin: 14.1 (ref 12.0–16.0)
Platelets: 242 10*3/uL (ref 150–400)
WBC: 11.9

## 2023-03-07 LAB — COMPREHENSIVE METABOLIC PANEL
Albumin: 3.9 (ref 3.5–5.0)
Calcium: 9 (ref 8.7–10.7)
Globulin: 2.7

## 2023-03-07 LAB — MICROALBUMIN / CREATININE URINE RATIO: Microalb Creat Ratio: 36

## 2023-03-07 LAB — CBC: RBC: 4.91 (ref 3.87–5.11)

## 2023-03-07 LAB — TSH: TSH: 1.33 (ref 0.41–5.90)

## 2023-03-07 LAB — PROTEIN / CREATININE RATIO, URINE
Albumin, U: 45.6
Creatinine, Urine: 128.4

## 2023-03-17 ENCOUNTER — Encounter (INDEPENDENT_AMBULATORY_CARE_PROVIDER_SITE_OTHER): Payer: Self-pay | Admitting: Family Medicine

## 2023-03-17 ENCOUNTER — Ambulatory Visit (INDEPENDENT_AMBULATORY_CARE_PROVIDER_SITE_OTHER): Payer: BC Managed Care – PPO | Admitting: Family Medicine

## 2023-03-17 VITALS — BP 129/87 | HR 77 | Temp 98.4°F | Ht 67.0 in | Wt 371.0 lb

## 2023-03-17 DIAGNOSIS — E559 Vitamin D deficiency, unspecified: Secondary | ICD-10-CM

## 2023-03-17 DIAGNOSIS — Z6841 Body Mass Index (BMI) 40.0 and over, adult: Secondary | ICD-10-CM

## 2023-03-17 DIAGNOSIS — I10 Essential (primary) hypertension: Secondary | ICD-10-CM | POA: Diagnosis not present

## 2023-03-17 DIAGNOSIS — E669 Obesity, unspecified: Secondary | ICD-10-CM | POA: Diagnosis not present

## 2023-03-17 MED ORDER — CHLORTHALIDONE 25 MG PO TABS: 12.5000 mg | ORAL_TABLET | Freq: Every day | ORAL | 0 refills | Status: DC

## 2023-03-17 MED ORDER — VITAMIN D (ERGOCALCIFEROL) 1.25 MG (50000 UNIT) PO CAPS: 50000.0000 [IU] | ORAL_CAPSULE | ORAL | 0 refills | Status: DC

## 2023-03-17 NOTE — Progress Notes (Signed)
Chief Complaint:   OBESITY [name] is here to discuss her progress with her obesity treatment plan along with follow-up of her obesity related diagnoses. [name] is on the Category 3 Plan and states she is following her eating plan approximately 50% of the time. [name] states she is swimming and doing strengthening for 30-60 minutes 2-3 times per week.  Today's visit was #: 35 Starting weight: 375 lbs Starting date: 03/17/2020 Today's weight: 371 lbs Today's date: 03/17/2023 Total lbs lost to date: 4 Total lbs lost since last in-office visit: 0  Interim History: Patient has had a very restful summer.  She took June off and rested.  July she traveled quite a bit with her son.  In the beginning of July she did a [employer] with him and traveled up the [place].  She found that it was difficult to eat nutritiously while away. She is planning to get back on meal plan.  She had labs done Monday and her A1c went up to 6.2.  She has been trying to change her eating habits to be more in control of her carb control.  She is looking into the gastric sleeve for either Christmas break or next summer.  Subjective:   1. Essential hypertension Patient is on chlorthalidone and amlodipine. Her blood pressure is controlled today. She denies chest pain, chest pressure, or headache.   2. Vitamin D deficiency Patient is on prescription Vitamin D. She denies nausea, vomiting, or muscle weakness but notes fatigue.   Assessment/Plan:   1. Essential hypertension Patient will continue her medications, and we will refill chlorthalidone for 90 days.   - chlorthalidone (HYGROTON) 25 MG tablet; Take 0.5 tablets (12.5 mg total) by mouth daily.  Dispense: 90 tablet; Refill: 0  2. Vitamin D deficiency Patient will continue prescription Vitamin D once weekly, and we will refill for 1 month.   - Vitamin D, Ergocalciferol, (DRISDOL) 1.25 MG (50000 UNIT) CAPS capsule; Take 1 capsule (50,000 Units total) by  mouth every 7 (seven) days.  Dispense: 4 capsule; Refill: 0  3. BMI 50.0-59.9, adult ([facility])  4. Obesity with starting BMI of 58.7 [name] is currently in the action stage of change. As such, her goal is to continue with weight loss efforts. She has agreed to the Category 3 Plan and keeping a food journal and adhering to recommended goals of 1400-1500 calories and 90+ grams of protein daily.   Exercise goals: All adults should avoid inactivity. Some physical activity is better than none, and adults who participate in any amount of physical activity gain some health benefits.  Behavioral modification strategies: increasing lean protein intake, meal planning and cooking strategies, keeping healthy foods in the home, and planning for success.  [name] has agreed to follow-up with our clinic in 5 to 6 weeks. She was informed of the importance of frequent follow-up visits to maximize her success with intensive lifestyle modifications for her multiple health conditions.   Objective:   Blood pressure 129/87, pulse 77, temperature 98.4 F (36.9 C), height 5\' 7"  (1.702 m), weight (!) 371 lb (168.3 kg), SpO2 98%. Body mass index is 58.11 kg/m.  General: Cooperative, alert, well developed, in no acute distress. HEENT: Conjunctivae and lids unremarkable. Cardiovascular: Regular rhythm.  Lungs: Normal work of breathing. Neurologic: No focal deficits.   Lab Results  Component Value Date   CREATININE 0.87 03/17/2020   BUN 12 03/17/2020   NA 138 03/17/2020   K 4.2 03/17/2020  CL 102 03/17/2020   CO2 21 03/17/2020   Lab Results  Component Value Date   ALT 13 03/17/2020   AST 19 03/17/2020   ALKPHOS 54 03/17/2020   BILITOT 0.3 03/17/2020   Lab Results  Component Value Date   HGBA1C 5.7 (H) 03/17/2020   Lab Results  Component Value Date   INSULIN 37.9 (H) 03/17/2020   No results found for: "TSH" No results found for: "CHOL", "HDL", "LDLCALC", "LDLDIRECT", "TRIG", "CHOLHDL" Lab Results   Component Value Date   VD25OH 11.8 (L) 03/17/2020   No results found for: "WBC", "HGB", "HCT", "MCV", "PLT" No results found for: "IRON", "TIBC", "FERRITIN"  Attestation Statements:   Reviewed by clinician on day of visit: allergies, medications, problem list, medical history, surgical history, family history, social history, and previous encounter notes.   I, Burt Knack, am acting as transcriptionist for Reuben Likes, MD.  I have reviewed the above documentation for accuracy and completeness, and I agree with the above. - Reuben Likes, MD

## 2023-04-04 ENCOUNTER — Other Ambulatory Visit (INDEPENDENT_AMBULATORY_CARE_PROVIDER_SITE_OTHER): Payer: Self-pay

## 2023-04-21 ENCOUNTER — Ambulatory Visit (INDEPENDENT_AMBULATORY_CARE_PROVIDER_SITE_OTHER): Payer: BC Managed Care – PPO | Admitting: Family Medicine

## 2023-04-27 ENCOUNTER — Ambulatory Visit (INDEPENDENT_AMBULATORY_CARE_PROVIDER_SITE_OTHER): Payer: BC Managed Care – PPO | Admitting: Family Medicine

## 2023-04-27 ENCOUNTER — Encounter (INDEPENDENT_AMBULATORY_CARE_PROVIDER_SITE_OTHER): Payer: Self-pay | Admitting: Family Medicine

## 2023-04-27 VITALS — BP 124/75 | HR 84 | Temp 98.5°F | Ht 67.0 in | Wt 370.0 lb

## 2023-04-27 DIAGNOSIS — E559 Vitamin D deficiency, unspecified: Secondary | ICD-10-CM | POA: Insufficient documentation

## 2023-04-27 DIAGNOSIS — E669 Obesity, unspecified: Secondary | ICD-10-CM | POA: Diagnosis not present

## 2023-04-27 DIAGNOSIS — I10 Essential (primary) hypertension: Secondary | ICD-10-CM

## 2023-04-27 DIAGNOSIS — Z6841 Body Mass Index (BMI) 40.0 and over, adult: Secondary | ICD-10-CM | POA: Insufficient documentation

## 2023-04-27 MED ORDER — VITAMIN D (ERGOCALCIFEROL) 1.25 MG (50000 UNIT) PO CAPS
50000.0000 [IU] | ORAL_CAPSULE | ORAL | 0 refills | Status: DC
Start: 2023-04-27 — End: 2023-05-25

## 2023-05-04 NOTE — Progress Notes (Unsigned)
Chief Complaint:   OBESITY [name] is here to discuss her progress with her obesity treatment plan along with follow-up of her obesity related diagnoses. [name] is on the Category 3 Plan or keeping a food journal and adhering to recommended goals of 1400-1500 calories and 90+ grams of protein and states she is following her eating plan approximately 80% of the time. [name] states she is doing chair yoga, hooping, and stretching for 45 minutes 3 times per week.  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  She is thinking about having gastric sleeve surgery.  Subjective:   1. Vitamin D deficiency Patient is on vitamin D, and she requests a refill today.  2. Essential hypertension Patient is on 3 blood pressure medications, and she is working on her weight loss.  Assessment/Plan:   1. Vitamin D deficiency Patient will continue prescription vitamin D 50,000 IU once weekly, and we will refill for 1 month.  - Vitamin D, Ergocalciferol, (DRISDOL) 1.25 MG (50000 UNIT) CAPS capsule; Take 1 capsule (50,000 Units total) by mouth every 7 (seven) days.  Dispense: 4 capsule; Refill: 0  2. Essential hypertension Patient will continue with her diet and medications, and she will continue to work on her weight loss.  3. BMI 50.0-59.9, adult ([facility])  4. Obesity, Beginning BMI of 58.7 [name] is currently in the action stage of change. As such, her goal is to continue with weight loss efforts. She has agreed to the Category 3 Plan.   Exercise goals: As is.   Behavioral modification strategies: no skipping meals and dealing with family or coworker sabotage.  [name] has agreed to follow-up with our clinic in 4 weeks. She was  informed of the importance of frequent follow-up visits to maximize her success with intensive lifestyle modifications for her multiple health conditions.   Objective:   Blood pressure 124/75, pulse 84, temperature 98.5 F (36.9 C), height 5\' 7"  (1.702 m), weight (!) 370 lb (167.8 kg), SpO2 94%. Body mass index is 57.95 kg/m.  Lab Results  Component Value Date   CREATININE 0.7 03/07/2023   BUN 12 03/07/2023   NA 137 03/07/2023   K 4.7 03/07/2023   CL 100 03/07/2023   CO2 26 (A) 03/07/2023   Lab Results  Component Value Date   ALT 28 03/07/2023   AST 30 03/07/2023   ALKPHOS 61 03/07/2023   BILITOT 0.3 03/17/2020   Lab Results  Component Value Date   HGBA1C 6.2 03/07/2023   HGBA1C 5.7 (H) 03/17/2020   Lab Results  Component Value Date   INSULIN 37.9 (H) 03/17/2020   Lab Results  Component Value Date   TSH 1.33 03/07/2023   Lab Results  Component Value Date   CHOL 172 03/07/2023   HDL 48 03/07/2023   LDLCALC 88 03/07/2023   TRIG 213 (A) 03/07/2023   Lab Results  Component Value Date   VD25OH 11.8 (L) 03/17/2020   Lab Results  Component Value Date   WBC 11.9 03/07/2023   HGB 14.1 03/07/2023   HCT 43 03/07/2023   PLT 242 03/07/2023   No results found for: "IRON", "TIBC", "FERRITIN"  Attestation Statements:   Reviewed by clinician on day of visit: allergies, medications,  problem list, medical history, surgical history, family history, social history, and previous encounter notes.   I, Burt Knack, am acting as transcriptionist for Quillian Quince, MD.  I have reviewed the above documentation for accuracy and completeness, and I agree with the above. -  Quillian Quince, MD

## 2023-05-25 ENCOUNTER — Other Ambulatory Visit (INDEPENDENT_AMBULATORY_CARE_PROVIDER_SITE_OTHER): Payer: Self-pay | Admitting: Family Medicine

## 2023-05-25 ENCOUNTER — Ambulatory Visit (INDEPENDENT_AMBULATORY_CARE_PROVIDER_SITE_OTHER): Payer: BC Managed Care – PPO | Admitting: Family Medicine

## 2023-05-25 ENCOUNTER — Encounter (INDEPENDENT_AMBULATORY_CARE_PROVIDER_SITE_OTHER): Payer: Self-pay | Admitting: Family Medicine

## 2023-05-25 VITALS — BP 135/86 | HR 84 | Temp 98.0°F | Ht 67.0 in | Wt 374.0 lb

## 2023-05-25 DIAGNOSIS — F439 Reaction to severe stress, unspecified: Secondary | ICD-10-CM | POA: Diagnosis not present

## 2023-05-25 DIAGNOSIS — I10 Essential (primary) hypertension: Secondary | ICD-10-CM

## 2023-05-25 DIAGNOSIS — E559 Vitamin D deficiency, unspecified: Secondary | ICD-10-CM

## 2023-05-25 DIAGNOSIS — E669 Obesity, unspecified: Secondary | ICD-10-CM | POA: Diagnosis not present

## 2023-05-25 DIAGNOSIS — Z6841 Body Mass Index (BMI) 40.0 and over, adult: Secondary | ICD-10-CM

## 2023-05-25 MED ORDER — CHLORTHALIDONE 25 MG PO TABS
12.5000 mg | ORAL_TABLET | Freq: Every day | ORAL | 0 refills | Status: AC
Start: 2023-05-25 — End: ?

## 2023-05-25 MED ORDER — VITAMIN D (ERGOCALCIFEROL) 1.25 MG (50000 UNIT) PO CAPS
50000.0000 [IU] | ORAL_CAPSULE | ORAL | 0 refills | Status: AC
Start: 2023-05-25 — End: ?

## 2023-05-26 NOTE — Progress Notes (Signed)
Chief Complaint:   OBESITY [name] is here to discuss her progress with her obesity treatment plan along with follow-up of her obesity related diagnoses. [name] is on the Category 3 Plan and states she is following her eating plan approximately 60% of the time. [name] states she is walking 60,000-70,000 steps 7 times per week.    Today's visit was #: 37 Starting weight: 375 lbs Starting date: 03/17/2020 Today's weight: 374 lbs Today's date: 05/25/2023 Total lbs lost to date: 1 Total lbs lost since last in-office visit: 0  Interim History: Patient continues to work on meal prepping.  She notes increased stress especially at work and she has not been able to exercise as much.  Subjective:   1. Stress Patient notes increased stress at work and she is being pulled in a lot of of different directions.  She has not been able to work on her own health.  2. Vitamin D deficiency Patient is on vitamin D with no side effects noted.  3. Essential hypertension Patient's blood pressure is stable today despite increase stress at work.  Assessment/Plan:   1. Stress Patient was offered support and encouragement, and we discussed the importance of setting boundaries.  2. Vitamin D deficiency Patient will continue once weekly prescription vitamin D 50,000 IU, and we will refill for 1 month.  - Vitamin D, Ergocalciferol, (DRISDOL) 1.25 MG (50000 UNIT) CAPS capsule; Take 1 capsule (50,000 Units total) by mouth every 7 (seven) days.  Dispense: 4 capsule; Refill: 0  3. Essential hypertension We will refill chlorthalidone 25 mg 1/2 tablet daily for 1 month.  Patient will continue to work on her diet, exercise, and weight loss.  - chlorthalidone (HYGROTON) 25 MG tablet; Take 0.5 tablets (12.5 mg total) by mouth daily.  Dispense: 30 tablet; Refill: 0  4. BMI 50.0-59.9, adult (HCC)  5. Obesity, Beginning BMI of 58.7 [name] is currently in the action stage of change. As such, her goal is to  continue with weight loss efforts. She has agreed to the Category 3 Plan.   Exercise goals: As is.   Behavioral modification strategies: increasing lean protein intake and meal planning and cooking strategies.  [name] has agreed to follow-up with our clinic in 4 weeks. She was informed of the importance of frequent follow-up visits to maximize her success with intensive lifestyle modifications for her multiple health conditions.   Objective:   Blood pressure 135/86, pulse 84, temperature 98 F (36.7 C), height 5\' 7"  (1.702 m), weight (!) 374 lb (169.6 kg), SpO2 99%. Body mass index is 58.58 kg/m.  Lab Results  Component Value Date   CREATININE 0.7 03/07/2023   BUN 12 03/07/2023   NA 137 03/07/2023   K 4.7 03/07/2023   CL 100 03/07/2023   CO2 26 (A) 03/07/2023   Lab Results  Component Value Date   ALT 28 03/07/2023   AST 30 03/07/2023   ALKPHOS 61 03/07/2023   BILITOT 0.3 03/17/2020   Lab Results  Component Value Date   HGBA1C 6.2 03/07/2023   HGBA1C 5.7 (H) 03/17/2020   Lab Results  Component Value Date   INSULIN 37.9 (H) 03/17/2020   Lab Results  Component Value Date   TSH 1.33 03/07/2023   Lab Results  Component Value Date   CHOL 172 03/07/2023   HDL 48 03/07/2023   LDLCALC 88 03/07/2023   TRIG 213 (A) 03/07/2023   Lab Results  Component Value Date   VD25OH 11.8 (L) 03/17/2020  Lab Results  Component Value Date   WBC 11.9 03/07/2023   HGB 14.1 03/07/2023   HCT 43 03/07/2023   PLT 242 03/07/2023   No results found for: "IRON", "TIBC", "FERRITIN"  Attestation Statements:   Reviewed by clinician on day of visit: allergies, medications, problem list, medical history, surgical history, family history, social history, and previous encounter notes.   I, Burt Knack, am acting as transcriptionist for Quillian Quince, MD.  I have reviewed the above documentation for accuracy and completeness, and I agree with the above. -  Quillian Quince, MD

## 2023-06-20 ENCOUNTER — Other Ambulatory Visit (INDEPENDENT_AMBULATORY_CARE_PROVIDER_SITE_OTHER): Payer: Self-pay | Admitting: Family Medicine

## 2023-06-20 DIAGNOSIS — E559 Vitamin D deficiency, unspecified: Secondary | ICD-10-CM

## 2023-06-22 ENCOUNTER — Ambulatory Visit (INDEPENDENT_AMBULATORY_CARE_PROVIDER_SITE_OTHER): Payer: BC Managed Care – PPO | Admitting: Family Medicine

## 2023-07-18 ENCOUNTER — Other Ambulatory Visit (INDEPENDENT_AMBULATORY_CARE_PROVIDER_SITE_OTHER): Payer: Self-pay | Admitting: Family Medicine

## 2023-07-18 DIAGNOSIS — I10 Essential (primary) hypertension: Secondary | ICD-10-CM

## 2023-10-21 ENCOUNTER — Other Ambulatory Visit (HOSPITAL_COMMUNITY): Payer: Self-pay | Admitting: General Surgery

## 2023-11-10 ENCOUNTER — Ambulatory Visit (HOSPITAL_COMMUNITY)
Admission: RE | Admit: 2023-11-10 | Discharge: 2023-11-10 | Disposition: A | Discharge status: Home / Self Care | Payer: Self-pay | Source: Ambulatory Visit | Attending: General Surgery | Admitting: General Surgery

## 2023-11-10 ENCOUNTER — Ambulatory Visit: Payer: Self-pay | Admitting: Dietician

## 2023-11-10 ENCOUNTER — Encounter (HOSPITAL_COMMUNITY): Payer: Self-pay

## 2023-11-10 ENCOUNTER — Encounter (HOSPITAL_COMMUNITY): Admission: RE | Admit: 2023-11-10 | Payer: Self-pay | Source: Ambulatory Visit

## 2023-11-14 ENCOUNTER — Ambulatory Visit (INDEPENDENT_AMBULATORY_CARE_PROVIDER_SITE_OTHER): Payer: Self-pay | Admitting: Licensed Clinical Social Worker

## 2023-11-14 DIAGNOSIS — F411 Generalized anxiety disorder: Secondary | ICD-10-CM | POA: Diagnosis not present
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
## 2023-11-17 ENCOUNTER — Encounter: Payer: Self-pay | Admitting: Neurology

## 2023-11-17 ENCOUNTER — Ambulatory Visit: Payer: Self-pay | Admitting: Neurology

## 2023-11-17 VITALS — BP 139/92 | HR 86 | Ht 67.0 in | Wt 398.6 lb

## 2023-11-17 DIAGNOSIS — G479 Sleep disorder, unspecified: Secondary | ICD-10-CM

## 2023-11-17 DIAGNOSIS — R519 Headache, unspecified: Secondary | ICD-10-CM

## 2023-11-17 DIAGNOSIS — R351 Nocturia: Secondary | ICD-10-CM

## 2023-11-17 DIAGNOSIS — G4719 Other hypersomnia: Secondary | ICD-10-CM

## 2023-11-17 DIAGNOSIS — Z9189 Other specified personal risk factors, not elsewhere classified: Secondary | ICD-10-CM | POA: Diagnosis not present

## 2023-11-17 DIAGNOSIS — R0683 Snoring: Secondary | ICD-10-CM

## 2023-11-17 DIAGNOSIS — Z6841 Body Mass Index (BMI) 40.0 and over, adult: Secondary | ICD-10-CM

## 2023-11-17 DIAGNOSIS — R030 Elevated blood-pressure reading, without diagnosis of hypertension: Secondary | ICD-10-CM

## 2023-11-17 NOTE — Progress Notes (Signed)
 Subjective:    Patient ID: [name] is a 53 y.o. female.  HPI    Latasha Foley, MD, PhD [facility] [address], [address] [address] [place], [place] [address]  Dear Dr. Sheliah Hatch,  I saw your patient, [name], upon your kind request in my sleep clinic today for initial consultation of her sleep disorder, in particular, concern for underlying obstructive sleep apnea.  The patient is unaccompanied today.  As you know, [name] is a 53 year old female with an underlying medical history of back pain, anxiety, depression, reflux disease, irritable bowel syndrome, hypertension, joint pain, and morbid obesity with a BMI of over 60, who reports snoring and excessive daytime somnolence.  Her Epworth sleepiness score is 10 out of 24, fatigue severity score is 42 out of 63.  She does not have a family history of sleep apnea but her husband had OSA and husband's brother has a CPAP machine.  She is familiar with CPAP therapy.  She is widowed, her 73 year old son lives with her.  She is a restless sleeper and tosses and turns a lot.  She has significant nocturia about 3-4 times per average night.  She has occasional morning headaches.  Bedtime is around 10 PM and rise time around 5 AM.  She is an Tourist information centre manager.  She starts work at [employer] AM.  She has no pets in the household.  She does have a TV in her bedroom but turned it off before falling asleep.  She quit smoking in 2006.  She drinks alcohol infrequently, maybe once a month, she drinks limited caffeine, 1 cup of coffee per day on average.  Of note, she has tried an over-the-counter oral appliance but it was uncomfortable and did not stay in place. I reviewed your office note from 10/13/2023.  She is being evaluated for bariatric surgery, particularly sleeve gastrectomy.  Her Past Medical History Is Significant For: Past Medical History:  Diagnosis Date   Abdominal pain    Anxiety    Back pain     Depression    Heartburn    Hypertension    Joint pain    Knee pain    Obesity    Tired     Her Past Surgical History Is Significant For: Past Surgical History:  Procedure Laterality Date   CESAREAN SECTION     COLONOSCOPY WITH PROPOFOL N/A 08/17/2022   Procedure: COLONOSCOPY WITH PROPOFOL;  Surgeon: Jeani Hawking, MD;  Location: [facility];  Service: Gastroenterology;  Laterality: N/A;   HEMOSTASIS CLIP PLACEMENT  08/17/2022   Procedure: HEMOSTASIS CLIP PLACEMENT;  Surgeon: Jeani Hawking, MD;  Location: [facility];  Service: Gastroenterology;;   NO PAST SURGERIES     POLYPECTOMY  08/17/2022   Procedure: POLYPECTOMY;  Surgeon: Jeani Hawking, MD;  Location: [facility];  Service: Gastroenterology;;    Her Family History Is Significant For: Family History  Problem Relation Age of Onset   Cancer Other    Hypertension Other    Anxiety disorder Mother    Depression Mother    Diabetes Father    Hypertension Father    Heart disease Father    Sudden death Father    Stroke Father    Cancer Father    Sleep apnea Father    Obesity Father     Her Social History Is Significant For: Social History   Socioeconomic History   Marital status: Widowed    Spouse name: Not on file   Number of  children: Not on file   Years of education: Not on file   Highest education level: Not on file  Occupational History   Occupation: teacher  Tobacco Use   Smoking status: Former    Current packs/day: 0.00    Types: Cigarettes    Quit date: 2015    Years since quitting: 10.2   Smokeless tobacco: Never  Substance and Sexual Activity   Alcohol use: Yes   Drug use: Never   Sexual activity: Not Currently  Other Topics Concern   Not on file  Social History Narrative   Not on file   Social Drivers of Health   Financial Resource Strain: Not on file  Food Insecurity: Not on file  Transportation Needs: Not on file  Physical Activity: Not on file  Stress: Not on file  Social Connections:  Not on file    Her Allergies Are:  Allergies  Allergen Reactions   Guaifenesin & Derivatives Hives and Swelling    Breakout on face and extremities   Lisinopril-Hydrochlorothiazide Nausea Only    Patient reports she is not allergic and is currently on this medication Dizzy  :   Her Current Medications Are:  Outpatient Encounter Medications as of 11/17/2023  Medication Sig   amLODipine (NORVASC) 10 MG tablet Take 10 mg by mouth daily.   Cyanocobalamin (VITAMIN B-12) 5000 MCG TBDP Take by mouth.   ibuprofen (ADVIL) 200 MG tablet Take 400 mg by mouth 2 (two) times daily.   norethindrone-ethinyl estradiol (LOESTRIN) 1-20 MG-MCG tablet Take 1 tablet by mouth daily.   Probiotic Product (PROBIOTIC PO) Take 1 capsule by mouth daily.   chlorthalidone (HYGROTON) 25 MG tablet Take 0.5 tablets (12.5 mg total) by mouth daily.   sertraline (ZOLOFT) 50 MG tablet Take 1 tablet (50 mg total) by mouth daily.   TURMERIC PO Take 1,500 mg by mouth 2 (two) times daily.   Vitamin D, Ergocalciferol, (DRISDOL) 1.25 MG (50000 UNIT) CAPS capsule Take 1 capsule (50,000 Units total) by mouth every 7 (seven) days.   No facility-administered encounter medications on file as of 11/17/2023.  :   Review of Systems:  Out of a complete 14 point review of systems, all are reviewed and negative with the exception of these symptoms as listed below:  Review of Systems  Neurological:        Room 9 Pt here Alone. Pt states that she goes tot the bathroom a lot at night which makes her have to wake up to go to the bathroom. Pt states that she has headaches. Pt  states that she will get 2-3 headaches per week.Pt states that she would like to discuss trying to sleep during the night and how to stop her bladder issues so she can get rest    Objective:  Neurological Exam  Physical Exam Physical Examination:   Vitals:   11/17/23 1345  BP: (!) 139/92  Pulse: 86    General Examination: The patient is a very pleasant 53  y.o. female in no acute distress. She appears well-developed and well-nourished and well groomed.   HEENT: Normocephalic, atraumatic, pupils are equal, round and reactive to light, extraocular tracking is good without limitation to gaze excursion or nystagmus noted. Hearing is grossly intact. Face is symmetric with normal facial animation. Speech is clear with no dysarthria noted. There is no hypophonia. There is no lip, neck/head, jaw or voice tremor. Neck is supple with full range of passive and active motion. There are no carotid bruits on  auscultation. Oropharynx exam reveals: mild mouth dryness, good dental hygiene and moderate airway crowding, due to small airway entry, tonsils on the smaller side, Mallampati class II, larger tongue noted.  Neck circumference 18 1/4 inches, minimal overbite noted.  Chest: Clear to auscultation without wheezing, rhonchi or crackles noted.  Heart: S1+S2+0, regular and normal without murmurs, rubs or gallops noted.   Abdomen: Soft, non-tender and non-distended.  Extremities: There is trace pitting edema in the distal lower extremities bilaterally.   Skin: Warm and dry without trophic changes noted.   Musculoskeletal: exam reveals no obvious joint deformities.   Neurologically:  Mental status: The patient is awake, alert and oriented in all 4 spheres. Her immediate and remote memory, attention, language skills and fund of knowledge are appropriate. There is no evidence of aphasia, agnosia, apraxia or anomia. Speech is clear with normal prosody and enunciation. Thought process is linear. Mood is normal and affect is normal.  Cranial nerves II - XII are as described above under HEENT exam.  Motor exam: Normal bulk, strength and tone is noted. There is no obvious action or resting tremor.  Fine motor skills and coordination: grossly intact.  Cerebellar testing: No dysmetria or intention tremor. There is no truncal or gait ataxia.  Sensory exam: intact to light  touch in the upper and lower extremities.  Gait, station and balance: She stands easily. No veering to one side is noted. No leaning to one side is noted. Posture is age-appropriate and stance is narrow based. Gait shows normal stride length and normal pace. No problems turning are noted.   Assessment and Plan:  In summary, ASHNI LONZO is a very pleasant 53 y.o.-year old female with an underlying medical history of back pain, anxiety, depression, reflux disease, irritable bowel syndrome, hypertension, joint pain, and morbid obesity with a BMI of over 60, whose history and physical exam are concerning for sleep disordered breathing, particularly obstructive sleep apnea (OSA). A laboratory attended sleep study is typically considered "gold standard" for evaluation of sleep disordered breathing.   I had a long chat with the patient about my findings and the diagnosis of sleep apnea, particularly OSA, its prognosis and treatment options. We talked about medical/conservative treatments, surgical interventions and non-pharmacological approaches for symptom control. I explained, in particular, the risks and ramifications of untreated moderate to severe OSA, especially with respect to developing cardiovascular disease down the road, including congestive heart failure (CHF), difficult to treat hypertension, cardiac arrhythmias (particularly A-fib), neurovascular complications including TIA, stroke and dementia. Even type 2 diabetes has, in part, been linked to untreated OSA. Symptoms of untreated OSA may include (but may not be limited to) daytime sleepiness, nocturia (i.e. frequent nighttime urination), memory problems, mood irritability and suboptimally controlled or worsening mood disorder such as depression and/or anxiety, lack of energy, lack of motivation, physical discomfort, as well as recurrent headaches, especially morning or nocturnal headaches. We talked about the importance of maintaining a healthy  lifestyle and striving for healthy weight. In addition, we talked about the importance of striving for and maintaining good sleep hygiene. I recommended a sleep study at this time. I outlined the differences between a laboratory attended sleep study which is considered more comprehensive and accurate over the option of a home sleep test (HST); the latter may lead to underestimation of sleep disordered breathing in some instances and does not help with diagnosing upper airway resistance syndrome and is not accurate enough to diagnose primary central sleep apnea typically. I outlined  possible surgical and non-surgical treatment options of OSA, including the use of a positive airway pressure (PAP) device (i.e. CPAP, AutoPAP/APAP or BiPAP in certain circumstances), a custom-made dental device (aka oral appliance, which would require a referral to a specialist dentist or orthodontist typically, and is generally speaking not considered for patients with full dentures or edentulous state), upper airway surgical options, such as traditional UPPP (which is not considered a first-line treatment) or the Inspire device (hypoglossal nerve stimulator, which would involve a referral for consultation with an ENT surgeon, after careful selection, following inclusion criteria - also not first-line treatment). I explained the PAP treatment option to the patient in detail, as this is generally considered first-line treatment.  The patient indicated that she would be willing to try PAP therapy, if the need arises. I explained the importance of being compliant with PAP treatment, not only for insurance purposes but primarily to improve patient's symptoms symptoms, and for the patient's long term health benefit, including to reduce Her cardiovascular risks longer-term.    We will pick up our discussion about the next steps and treatment options after testing.  We will keep her posted as to the test results by phone call and/or MyChart  messaging where possible.  We will plan to follow-up in sleep clinic accordingly as well.  I answered all her questions today and the patient was in agreement.   I encouraged her to call with any interim questions, concerns, problems or updates or email [place] through MyChart.  Generally speaking, sleep test authorizations may take up to 2 weeks, sometimes less, sometimes longer, the patient is encouraged to get in touch with [place] if they do not hear back from the sleep lab staff directly within the next 2 weeks.  Thank you very much for allowing me to participate in the care of this nice patient. If I can be of any further assistance to you please do not hesitate to call me at [phone].  Sincerely,   Latasha Foley, MD, PhD

## 2023-11-17 NOTE — Patient Instructions (Signed)

## 2023-11-21 ENCOUNTER — Telehealth: Payer: Self-pay | Admitting: Neurology

## 2023-11-21 NOTE — Telephone Encounter (Signed)
 [employer] state no auth req   Patient is scheduled at [facility] for 01/01/24 at 9 pm.  Mailed packet to the patient.

## 2023-12-01 ENCOUNTER — Encounter: Attending: General Surgery | Admitting: Skilled Nursing Facility1

## 2023-12-01 DIAGNOSIS — E119 Type 2 diabetes mellitus without complications: Secondary | ICD-10-CM | POA: Diagnosis present

## 2023-12-01 NOTE — Progress Notes (Signed)
 Nutrition Assessment for Bariatric Surgery: Pre-Surgery Behavioral and Nutrition Intervention Program   Medical Nutrition Therapy  Appt Start Time: 4:23    End Time: 5:30  Patient was seen on 12/01/2023 for Pre-Operative Nutrition Assessment. Purpose of todays visit  enhance perioperative outcomes along with a healthy weight maintenance   Referral stated Supervised Weight Loss (SWL) visits needed: 0  Not cleared at this time:  Pt to follow up for minimum of one more visit to assist pt with progressing through stages of change/further nutrition education. RD advised pt that this follow up visit is not mandated through insurance. Pt verbalized agreement: due to pts desire to Pause the surgery process at this time.  Pt states she really appreciates Dr. Sanda Linger care in helping her make the right decision for her best health and safety  Pt advsied she will reach out to [facility] to inform them of the pause.   Planned surgery: Sleeve Gastrectomy  Pt expectation of surgery: to lose weight Pt expectation of dietitian: pt states none     NUTRITION ASSESSMENT   Anthropometrics  Start weight at [facility]: 397.6 lbs (date: 12/01/2023)  Height: 67 in BMI: 62.27 kg/m2     Clinical   Pt state she works out about 20 minutes before work. 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 Pt states due to her emotional eating and stress she is going to pause the idea of having the sleeve until she is in a better place in her life to make the best decisions for her health and safety understanding the relationship between her emotions and food choices.   Pt states he will work on creating balanced meals, going swimming for fun a few days a week, and completing the mindful meals sheet to start working on her relationship with food.   Pt state she would like to retrun to continue to understand her relationship with food.    Pharmacotherapy: History of weight loss medication used: none stated  Medical hx: Prediabetes Medications: zoloft, see list   Labs: A1C 6.6 Notable signs/symptoms: none stated Any previous deficiencies? No  Evaluation of Nutritional Deficiencies: Micronutrient Nutrition Focused Physical Exam: Hair: No issues observed Eyes: No issues observed Mouth: No issues observed Neck: No issues observed Nails: No issues observed Skin: No issues observed  Lifestyle & Dietary Hx     Current Physical Activity Recommendations state 150 minutes per week of moderate to vigorous movement including Cardio and 1-2 days of resistance activities as well as flexibility/balance activities:  Pts current physical activity: 20 minutes 3 times a week, with 45% recommendation reached    Sleep Hygiene: duration and quality:   Current Patient Perceived Stress Level as stated by pt on a scale of 1-10 10       Stress Management Techniques:   According to the Dietary Guidelines for Americans Recommendation: equivalent 1.5-2 cups fruits per day, equivalent 2-3 cups vegetables per day and at least half all grains whole  Fruit servings per day (on average): , meeting % recommendation  Non-starchy vegetable servings per day (on average): , meeting % recommendation  Whole Grains per day (on average):   Number of meals missed/skipped per week out of 21:   24-Hr Dietary Recall First Meal:  Snack:  Second Meal:  Snack:  Third Meal:  Snack:  Beverages:   Alcoholic beverages per week:    Estimated Energy Needs Calories: 1800  NUTRITION DIAGNOSIS  Overweight/obesity (-3.3) related to past poor dietary habits and physical inactivity as evidenced by patient w/ planned sleeve surgery following dietary guidelines for continued weight loss.    NUTRITION INTERVENTION  Nutrition counseling (C-1) and education (E-2) to facilitate bariatric surgery goals.  Educated pt on micronutrient deficiencies  post-surgery and behavioral/dietary strategies to start in order to mitigate that risk   Behavioral and Dietary Interventions Pre-Op Goals Reviewed with the Patient Nutrition: Healthy Eating Behaviors Switch to non-caloric, non-carbonated and non-caffeinated beverages such as  water, unsweetened tea, Crystal Light and zero calorie beverages (aim for 64 oz. per day) Cut out grazing between meals or at night  Find a protein shake you like Eat every 3-5 hours        Eliminate distractions while eating (TV, computer, reading, driving, texting) Take 16-10 minutes to eat a meal  Decrease high sugar foods/decrease high fat/fried foods Eliminate alcoholic beverages Increase protein intake (eggs, fish, chicken, yogurt) before surgery Eat non starchy vegetables 2 times a day 7 days a week Eat complex carbohydrates such as whole grains and fruits   Behavioral Modification: Physical Activity Increase my usual daily activity (use stairs, park farther, etc.) Engage in _______________________  activity  _______ minutes ______ times per week  Other:    _________________________________________________________________     Problem Solving I will think about my usual eating patterns and how to tweak them How can my friends and family support me Barriers to starting my changes Learn and understand appetite verses hunger   Healthy Coping Allow for ___________ activities per week to help me manage stress Reframe negative thoughts I will keep a picture of someone or something that is my inspiration & look at it daily   Monitoring  Weigh myself once a week  Measure my progress by monitoring how my clothes fit Keep a food record of what I eat and drink for the next ________ (time period) Take pictures of what I eat and drink for the next ________ (time period) Use an app to count steps/day for the next_______ (time period) Measure my progress such as increased energy and more restful sleep Monitor your  acid reflux and bowel habits, are they getting better?   *Goals that are bolded indicate the pt would like to start working towards these  Handouts Provided Include  Bariatric Surgery handouts (Nutrition Visits, Pre Surgery Behavioral Change Goals, Protein Shakes Brands to Choose From, Vitamins & Mineral Supplementation)  Learning Style & Readiness for Change Teaching method utilized: Visual, Auditory, and hands on  Demonstrated degree of understanding via: Teach Back  Readiness Level: Pre-contemplative  Barriers to learning/adherence to lifestyle change: emotional eating      MONITORING & EVALUATION Dietary intake, weekly physical activity, body weight, and preoperative behavioral change goals   Next Steps  Patient is to follow up at [facility] in 4 weeks

## 2023-12-08 ENCOUNTER — Encounter (HOSPITAL_COMMUNITY): Admission: RE | Admit: 2023-12-08 | Source: Ambulatory Visit

## 2023-12-21 ENCOUNTER — Encounter (HOSPITAL_COMMUNITY): Payer: Self-pay

## 2023-12-21 ENCOUNTER — Ambulatory Visit (INDEPENDENT_AMBULATORY_CARE_PROVIDER_SITE_OTHER): Admitting: Licensed Clinical Social Worker

## 2023-12-21 DIAGNOSIS — F411 Generalized anxiety disorder: Secondary | ICD-10-CM
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
## 2024-01-01 ENCOUNTER — Ambulatory Visit (INDEPENDENT_AMBULATORY_CARE_PROVIDER_SITE_OTHER): Admitting: Neurology

## 2024-01-01 DIAGNOSIS — Z9189 Other specified personal risk factors, not elsewhere classified: Secondary | ICD-10-CM

## 2024-01-01 DIAGNOSIS — G4719 Other hypersomnia: Secondary | ICD-10-CM

## 2024-01-01 DIAGNOSIS — Z6841 Body Mass Index (BMI) 40.0 and over, adult: Secondary | ICD-10-CM

## 2024-01-01 DIAGNOSIS — G479 Sleep disorder, unspecified: Secondary | ICD-10-CM

## 2024-01-01 DIAGNOSIS — R030 Elevated blood-pressure reading, without diagnosis of hypertension: Secondary | ICD-10-CM

## 2024-01-01 DIAGNOSIS — G4733 Obstructive sleep apnea (adult) (pediatric): Secondary | ICD-10-CM

## 2024-01-01 DIAGNOSIS — R351 Nocturia: Secondary | ICD-10-CM

## 2024-01-01 DIAGNOSIS — R0683 Snoring: Secondary | ICD-10-CM

## 2024-01-01 DIAGNOSIS — G4734 Idiopathic sleep related nonobstructive alveolar hypoventilation: Secondary | ICD-10-CM

## 2024-01-01 DIAGNOSIS — G4720 Circadian rhythm sleep disorder, unspecified type: Secondary | ICD-10-CM

## 2024-01-01 DIAGNOSIS — R519 Headache, unspecified: Secondary | ICD-10-CM

## 2024-01-04 NOTE — Procedures (Signed)
 Physician Interpretation:     [facility] POLYSOMNOGRAPHY  INTERPRETATION REPORT   STUDY DATE:  01/01/2024     PATIENT NAME:  [name]         DATE OF BIRTH:  [birth year]  PATIENT ID:  [id number]    TYPE OF STUDY:  PSG  READING PHYSICIAN: Debbra Fairy, MD, PhD   SCORING TECHNICIAN: Octavia Belton, RPSGT     Referred by: Dr. Harman Lightning ? History and Indication for Testing: 53 year old female with an underlying medical history of back pain, anxiety, depression, reflux disease, irritable bowel syndrome, hypertension, joint pain, and morbid obesity with a BMI of over 60, who reports snoring and excessive daytime somnolence. Her Epworth sleepiness score is 10 out of 24, fatigue severity score is 42 out of 63. Height: 67 in Weight: 398 lb (BMI 62) Neck Size: 18 in    MEDICATIONS: Norvasc, Vitamin B12, Advil, Loestrin, Probiotic, Hygroton , Zoloft , Turmeric, Vitamin D    TECHNICAL DESCRIPTION: A registered sleep technologist  was in attendance for the duration of the recording.  Data collection, scoring, video monitoring, and reporting were performed in compliance with the AASM Manual for the Scoring of Sleep and Associated Events; (Hypopnea is scored based on the criteria listed in Section VIII D. 1b in the AASM Manual V2.6 using a 4% oxygen desaturation rule or Hypopnea is scored based on the criteria listed in Section VIII D. 1a in the AASM Manual V2.6 using 3% oxygen desaturation and /or arousal rule).   SLEEP CONTINUITY AND SLEEP ARCHITECTURE:  Lights-out was at 21:58: and lights-on at  05:02:, with a total recording time of 7 hours, 4 min. Total sleep time ( TST) was 373.5 minutes with a normal sleep efficiency at 88.1%. There was  5.4% REM sleep.  BODY POSITION:  TST was divided  between the following sleep positions: 42.3% supine;  42.0% lateral;  16% prone. Duration of total sleep and percent of total sleep in their respective position is as  follows: supine 158 minutes (42%), non-supine 216 minutes (58%); right 00 minutes (0%), left 157 minutes (42%), and prone 58 minutes (16%).  Total supine REM sleep time was 20 minutes (100% of total REM sleep).  Sleep latency was normal at 16.5 minutes.  REM sleep latency was markedly increased at 263.0 minutes. Of the total sleep time, the percentage of stage N1 sleep was 13.4%, which is increased, stage N2 sleep was 81%, which markedly increased, stage N3 sleep was absent, and REM sleep was 5.4%, which is markedly reduced. Wake after sleep onset (WASO) time accounted for 34 minutes with mild to moderate sleep fragmentation noted.   RESPIRATORY MONITORING:   Based on CMS criteria (using a 4% oxygen desaturation rule for scoring hypopneas), there were 41 apneas (41 obstructive; 0 central; 0 mixed), and 194 hypopneas.  Apnea index was 6.6. Hypopnea index was 31.2. The apnea-hypopnea index was 37.8 overall (55.4 supine, 0 non-supine; 117.0 REM, 117.0 supine REM).  There were 0 respiratory effort-related arousals (RERAs).  The RERA index was 0 events/h. Total respiratory disturbance index (RDI) was 37.8 events/h. RDI results showed: supine RDI  55.4 /h; non-supine RDI 24.8 /h; REM RDI 117.0 /h, supine REM RDI 117.0 /h.   Based on AASM criteria (using a 3% oxygen desaturation and /or arousal rule for scoring hypopneas), there were 41 apneas (41 obstructive; 0 central; 0 mixed), and 248 hypopneas. Apnea index was 6.6. Hypopnea index was 39.8. The apnea-hypopnea index was 46.4/hour overall (64.6 supine,  0 non-supine; 120.0 REM, 120.0 supine REM).  There were 0 respiratory effort-related arousals (RERAs).  The RERA index was 0 events/h. Total respiratory disturbance index (RDI) was 46.4 events/h. RDI results showed: supine RDI  64.6 /h; non-supine RDI 33.1 /h; REM RDI 120.0 /h, supine REM RDI 120.0 /h.    OXIMETRY: Oxyhemoglobin Saturation Nadir during sleep was at  52% from a mean of 91%.  Of the Total sleep  time (TST)   hypoxemia (=<88%) was present for  38.1 minutes, or 10.2% of total sleep time.  LIMB MOVEMENTS: There were 34 periodic limb movements of sleep (5.5/hr), of which 3 (0.5/hr) were associated with an arousal.   AROUSAL: There were 87 arousals in total, for an arousal index of 14 arousals/hour.  Of these, 52 were identified as respiratory-related arousals (8 /h), 3 were PLM-related arousals (0 /h), and 52 were non-specific arousals (8 /h).    EEG: Review of the EEG showed no abnormal electrical discharges and symmetrical bihemispheric findings.      EKG: The EKG revealed normal sinus rhythm (NSR). The average heart rate during sleep was 81 bpm.     AUDIO/VIDEO REVIEW: The audio and video review did not show any abnormal or unusual behaviors, movements, phonations or vocalizations. The patient took 1 restroom break. Snoring was noted, in the mild to moderate range.    POST-STUDY QUESTIONNAIRE: Post study, the patient indicated, that sleep was better than usual.     IMPRESSION:    1. Severe Obstructive Sleep Apnea (OSA) 2. Dysfunctions associated with sleep stages or arousal from sleep 3. Nocturnal hypoxemia  RECOMMENDATIONS:    1. This study demonstrates severe obstructive sleep apnea, with a total AHI of 46.4/hour, REM AHI of 120/hour, supine AHI of 64.6/hour and O2 nadir of 52% (during supine REM sleep). There was significant time below 89% saturation of over 35 minutes for the night, indicating nocturnal hypoxemia.  Treatment with a positive airway pressure (PAP) device is highly recommended.  The patient will be advised to proceed with home autoPAP therapy for now.  A laboratory attended, full night PAP-titration study can be considered down the road, to optimize therapy settings, mask fit, monitoring of tolerance and of proper oxygen saturations. Other treatment options may be limited, and may include (generally speaking) surgical options in selected patients.  Concomitant  weight loss is highly recommended (patient is pursuing bariatric surgery). Please note that untreated obstructive sleep apnea may carry additional perioperative morbidity. Patients with significant obstructive sleep apnea should receive perioperative PAP therapy and the surgeons and particularly the anesthesiologist should be informed of the diagnosis and the severity of the sleep disordered breathing. 2. This study shows sleep fragmentation and abnormal sleep stage percentages; these are nonspecific findings and per se do not signify an intrinsic sleep disorder or a cause for the patient's sleep-related symptoms. Causes include (but are not limited to) the first night effect of the sleep study, circadian rhythm disturbances, medication effect or an underlying mood disorder or medical problem.  3. The patient should be cautioned not to drive, work at heights, or operate dangerous or heavy equipment when tired or sleepy. Review and reiteration of good sleep hygiene measures should be pursued with any patient. 4. The patient will be seen in follow-up in the sleep clinic at [facility] for discussion of the test results, symptom and treatment compliance review, further management strategies, etc. The patient and the referring provider will be notified of the test results.   I certify that I  have reviewed the entire raw data recording prior to the issuance of this report in accordance with the Standards of Accreditation of the American Academy of Sleep Medicine (AASM).  Debbra Fairy, MD, PhD Medical Director, [facility]) Diplomat, ABPN (Neurology and Sleep)               Technical Report:   General Information  Name: Channon, Brougher BMI: 62.34 Physician: Debbra Fairy, MD  ID: [id number] Height: 67.0 in Technician: Octavia Belton, RPSGT  Sex: Female Weight: 398.0 lb Record: [id number]  Age: 67 [1971-04-29] Date: 01/01/2024    Medical & Medication History     Ms. Dredge is a 53 year old female with an underlying medical history of back pain, anxiety, depression, reflux disease, irritable bowel syndrome, hypertension, joint pain, and morbid obesity with a BMI of over 60, who reports snoring and excessive daytime somnolence. Her Epworth sleepiness score is 10 out of 24, fatigue severity score is 42 out of 63. She does not have a family history of sleep apnea but her husband had OSA and husband's brother has a CPAP machine. She is familiar with CPAP therapy. She is widowed, her 86 year old son lives with her. She is a restless sleeper and tosses and turns a lot. She has significant nocturia about 3-4 times per average night. She has occasional morning headaches. Bedtime is around 10 PM and rise time around 5 AM. She is an Tourist information centre manager. She starts work at [employer] AM. She has no pets in the household. She does have a TV in her bedroom but turned it off before falling asleep. She quit smoking in 2006. She drinks alcohol infrequently, maybe once a month, she drinks limited caffeine, 1 cup of coffee per day on average. Of note, she has tried an over-the-counter oral appliance but it was uncomfortable and did not stay in place. She is being evaluated for bariatric surgery, particularly sleeve gastrectomy.  Norvasc, Vitamin B12, Advil, Loestrin, Probiotic, Hygroton , Zoloft , Turmeric, Vitamin D    Sleep Disorder      Comments   Patient arrived for a diagnostic polysomnogram. Procedure explained and all questions answered. Standard paste setup without complications. Patient slept supine, left, right, and prone. Mild to moderate snoring was noted. Respiratory events noted, considerably worse while in supine REM sleep. After 2 hours total sleep time, AHI = 29.0. No obvious cardiac arrhythmias noted. Mild PLMS observed. One restroom visit.    Lights out: 09:58:23 PM Lights on: 05:02:35 AM   Time Total Supine Side Prone Upright  Recording (TRT) 7h 4.68m 2h 54.77m 3h  9.14m 1h 1.18m 0h 0.3m  Sleep (TST) 6h 13.39m 2h 38.32m 2h 37.75m 0h 58.2m 0h 0.93m   Latency N1 N2 N3 REM Onset Per. Slp. Eff.  Actual 0h 0.63m 0h 6.54m 0h 0.24m 4h 23.13m 0h 16.77m 0h 22.38m 88.09%   Stg Dur Wake N1 N2 N3 REM  Total 50.5 50.0 303.5 0.0 20.0  Supine 16.0 32.0 106.0 0.0 20.0  Side 32.0 14.0 143.0 0.0 0.0  Prone 2.5 4.0 54.5 0.0 0.0  Upright 0.0 0.0 0.0 0.0 0.0   Stg % Wake N1 N2 N3 REM  Total 11.9 13.4 81.3 0.0 5.4  Supine 3.8 8.6 28.4 0.0 5.4  Side 7.5 3.7 38.3 0.0 0.0  Prone 0.6 1.1 14.6 0.0 0.0  Upright 0.0 0.0 0.0 0.0 0.0     Apnea Summary Sub Supine Side Prone Upright  Total 41 Total 41 40 1 0 0    REM  24 24 0 0 0    NREM 17 16 1  0 0  Obs 41 REM 24 24 0 0 0    NREM 17 16 1  0 0  Mix 0 REM 0 0 0 0 0    NREM 0 0 0 0 0  Cen 0 REM 0 0 0 0 0    NREM 0 0 0 0 0   Rera Summary Sub Supine Side Prone Upright  Total 0 Total 0 0 0 0 0    REM 0 0 0 0 0    NREM 0 0 0 0 0   Hypopnea Summary Sub Supine Side Prone Upright  Total 248 Total 248 130 92 26 0    REM 16 16 0 0 0    NREM 232 114 92 26 0   4% Hypopnea Summary Sub Supine Side Prone Upright  Total (4%) 194 Total 194 106 69 19 0    REM 15 15 0 0 0    NREM 179 91 69 19 0     AHI Total Obs Mix Cen  46.43 Apnea 6.59 6.59 0.00 0.00   Hypopnea 39.84 -- -- --  37.75 Hypopnea (4%) 31.16 -- -- --    Total Supine Side Prone Upright  Position AHI 46.43 64.56 35.54 26.67 0.00  REM AHI 120.00   NREM AHI 42.26   Position RDI 46.43 64.56 35.54 26.67 0.00  REM RDI 120.00   NREM RDI 42.26    4% Hypopnea Total Supine Side Prone Upright  Position AHI (4%) 37.75 55.44 26.75 19.49 0.00  REM AHI (4%) 117.00   NREM AHI (4%) 33.27   Position RDI (4%) 37.75 55.44 26.75 19.49 0.00  REM RDI (4%) 117.00   NREM RDI (4%) 33.27    Desaturation Information Threshold: 2% <100% <90% <80% <70% <60% <50% <40%  Supine 213.0 113.0 48.0 25.0 4.0 0.0 0.0  Side 206.0 54.0 2.0 0.0 0.0 0.0 0.0  Prone 59.0 8.0 0.0 0.0 0.0 0.0 0.0  Upright  0.0 0.0 0.0 0.0 0.0 0.0 0.0  Total 478.0 175.0 50.0 25.0 4.0 0.0 0.0  Index 70.4 25.8 7.4 3.7 0.6 0.0 0.0   Threshold: 3% <100% <90% <80% <70% <60% <50% <40%  Supine 185.0 113.0 48.0 25.0 4.0 0.0 0.0  Side 154.0 54.0 2.0 0.0 0.0 0.0 0.0  Prone 40.0 8.0 0.0 0.0 0.0 0.0 0.0  Upright 0.0 0.0 0.0 0.0 0.0 0.0 0.0  Total 379.0 175.0 50.0 25.0 4.0 0.0 0.0  Index 55.8 25.8 7.4 3.7 0.6 0.0 0.0   Threshold: 4% <100% <90% <80% <70% <60% <50% <40%  Supine 144.0 110.0 48.0 25.0 4.0 0.0 0.0  Side 102.0 50.0 2.0 0.0 0.0 0.0 0.0  Prone 28.0 8.0 0.0 0.0 0.0 0.0 0.0  Upright 0.0 0.0 0.0 0.0 0.0 0.0 0.0  Total 274.0 168.0 50.0 25.0 4.0 0.0 0.0  Index 40.3 24.7 7.4 3.7 0.6 0.0 0.0   Threshold: 3% <100% <90% <80% <70% <60% <50% <40%  Supine 185 113 48 25 4 0 0  Side 154 54 2 0 0 0 0  Prone 40 8 0 0 0 0 0  Upright 0 0 0 0 0 0 0  Total 379 175 50 25 4 0 0   Awakening/Arousal Information # of Awakenings 28  Wake after sleep onset 34.62m  Wake after persistent sleep 29.48m   Arousal Assoc. Arousals Index  Apneas 16 2.6  Hypopneas 36 5.8  Leg Movements 4 0.6  Snore 0  0.0  PTT Arousals 0 0.0  Spontaneous 52 8.4  Total 108 17.3  Leg Movement Information PLMS LMs Index  Total LMs during PLMS 34 5.5  LMs w/ Microarousals 3 0.5   LM LMs Index  w/ Microarousal 1 0.2  w/ Awakening 2 0.3  w/ Resp Event 0 0.0  Spontaneous 15 2.4  Total 16 2.6     Desaturation threshold setting: 3% Minimum desaturation setting: 10 seconds SaO2 nadir: 52% The longest event was a 29 sec obstructive Hypopnea with a minimum SaO2 of 86%. The lowest SaO2 was 52% associated with a 14 sec obstructive Apnea. EKG Rates EKG Avg Max Min  Awake 83 106 74  Asleep 81 92 74  EKG Events: Tachycardia

## 2024-01-05 ENCOUNTER — Ambulatory Visit: Payer: Self-pay | Admitting: Neurology

## 2024-01-05 DIAGNOSIS — G4733 Obstructive sleep apnea (adult) (pediatric): Secondary | ICD-10-CM

## 2024-01-05 DIAGNOSIS — G4734 Idiopathic sleep related nonobstructive alveolar hypoventilation: Secondary | ICD-10-CM

## 2024-01-05 NOTE — Telephone Encounter (Signed)
-----   Message from Debbra Fairy sent at 01/05/2024  4:28 PM EDT ----- Urgent set up requested on PAP therapy, due to severe OSA. Patient referred by Dr. Dorrie Gaudier, seen by me on 11/17/23, diagnostic PSG on 01/01/24.    Please call and notify the patient that the recent sleep study showed severe obstructive sleep apnea. I recommend treatment for in the form of autoPAP. We may consider at a CPAP titration study at a later date, if need be, which means, that we would ask her to come back in for a second sleep study with CPAP treatment. For now, I would like to start her on a so-called autoPAP machine at home, through a DME company (of her choice, or as per insurance requirement). The DME representative will educate her on how to use the machine, how to put the mask on, etc. I have placed an order in the chart. Please send referral, talk to patient, send report to referring MD. We will need a FU in sleep clinic for 10 weeks post-PAP set up, please arrange that with me or one of our NPs. Thanks,   Debbra Fairy, MD, PhD [facility])

## 2024-01-05 NOTE — Telephone Encounter (Signed)
 Called pt and LVM (ok per DPR) with office number and hours asking for call back as soon as possible to discuss sleep study results. Informed pt that her study did show severe OSA and Dr Omar Bibber recommends treatment initiation as soon as possible.

## 2024-01-06 NOTE — Telephone Encounter (Signed)
 Called pt a second time and LVM (ok per DPR) asking for call back to discuss sleep study. I let the patient know we are closed for [facility] Day. Will call her again on Tuesday unless we hear from her first. Left office number in message and also sent pt a mychart message.

## 2024-01-10 NOTE — Telephone Encounter (Signed)
 Salick, Jeanne Milliner, Jodeen Munch, RN; Zott, [place]; Bolton, Tammy Got it thanks!

## 2024-01-10 NOTE — Telephone Encounter (Signed)
 I spoke with the patient and discussed her sleep study results as noted below. The patient verbalized understanding and agreement to proceed. Her questions were answered. She will watch for a call from [facility] for setup. We discussed the insurance compliance requirements which includes using the machine at least 4 hours at night (goal is all night)and also being seen by our office between 30 and 90 days after setup. Patient verbalized appreciation for the call.  Urgent referral sent to [facility]. Sleep study report sent to referring provider.

## 2024-01-12 ENCOUNTER — Ambulatory Visit: Admitting: Skilled Nursing Facility1

## 2024-01-18 ENCOUNTER — Ambulatory Visit: Admitting: Skilled Nursing Facility1

## 2024-01-26 ENCOUNTER — Ambulatory Visit (HOSPITAL_COMMUNITY): Admitting: Licensed Clinical Social Worker

## 2024-02-01 ENCOUNTER — Ambulatory Visit (INDEPENDENT_AMBULATORY_CARE_PROVIDER_SITE_OTHER): Admitting: Licensed Clinical Social Worker

## 2024-02-01 DIAGNOSIS — F411 Generalized anxiety disorder: Secondary | ICD-10-CM

## 2024-02-01 NOTE — Progress Notes (Signed)
 Virtual Visit via Video Note  I connected with [name] on 02/01/24 at  1:00 PM EDT by a video enabled telemedicine application and verified that I am speaking with the correct person using two identifiers.  Location: Patient: Home Provider: Office   I discussed the limitations of evaluation and management by telemedicine and the availability of in person appointments. The patient expressed understanding and agreed to proceed.   I discussed the assessment and treatment plan with the patient. The patient was provided an opportunity to ask questions and all were answered. The patient agreed with the plan and demonstrated an understanding of the instructions.   The patient was advised to call back or seek an in-person evaluation if the symptoms worsen or if the condition fails to improve as anticipated.  I provided 42 minutes of non-face-to-face time during this encounter.   Braxton Calico, LCSW   THERAPIST PROGRESS NOTE  Session Time: 1:12 pm-1:56 pm  Type of Therapy: Individual Therapy  Purpose of session/Treatment Goals addressed: [name] will manage mood and anxiety as evidenced by reducing and eliminating negative self talk, manage worry thoughts, process grief, and learn how to relax for 5 out of 7 days for 60 days.   Interventions: Therapist utilized CBT, ACT,  and Solution Focused brief therapy  to address anxiety. Therapist provided support and empathy to patient during session.    Effectiveness: Patient was oriented x4 (person, place, situation, and time) . Patient was  Appropriate. Patient was Casually dress. Patient has been working on her road rage. She is catching her thoughts and feels like she has not been reacting. Patient's stress level is going down and she is able to relax. She is planning on starting swimming next week because that is the only activity where she can fully focus and be present. Patient was able to label her negative thinking as descent which is a  reference to a movie about a group of hikers/cavers that get trapped in a cave. She is working on Building services engineer the negative voice in her head and allow space for the positive voice in her head. 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 She feels like she can have more freedom to be creative outside of the school system.   Patient engaged in session. Patient responded well to interventions. Patient continues to meet criteria for Generalized anxiety disorder  Patient will continue in outpatient therapy due to being the least restrictive service to meet her needs. Patient made minimal progress on her goals at this time.      12/21/2023    3:15 PM 03/17/2020    7:11 AM 12/31/2015    4:12 PM  Depression screen PHQ 2/9  Decreased Interest 2 1 0  Down, Depressed, Hopeless 1 3 0  PHQ - 2 Score 3 4 0  Altered sleeping 1 0   Tired, decreased energy 1 1   Change in appetite 2 1   Feeling bad or failure about yourself  3 2   Trouble concentrating 0 0   Moving slowly or fidgety/restless 0 2   Suicidal thoughts 0 0   PHQ-9 Score 10 10   Difficult doing work/chores  Somewhat difficult        12/21/2023    3:16 PM  GAD 7 : Generalized Anxiety Score  Nervous, Anxious, on Edge 1  Control/stop worrying 2  Worry too much -  different things 2  Trouble relaxing 1  Restless 0  Easily annoyed or irritable 1  Afraid - awful might happen 1  Total GAD 7 Score 8  Anxiety Difficulty Somewhat difficult        Suicidal/Homicidal:  No without intent/plan   Recent stressful life event and Widow/widower  Protective Factors: positive social support and coping skills  Plan: Patient will return in 2-4 weeks.   Diagnosis: Axis I: Generalized anxiety disorder   Collaboration of Care: Other [facility]  Patient/Guardian was advised Release of Information must be obtained prior to any  record release in order to collaborate their care with an outside provider. Patient/Guardian was advised if they have not already done so to contact the registration department to sign all necessary forms in order for us  to release information regarding their care.   Consent: Patient/Guardian gives verbal consent for treatment and assignment of benefits for services provided during this visit. Patient/Guardian expressed understanding and agreed to proceed.

## 2024-02-08 ENCOUNTER — Ambulatory Visit: Admitting: Skilled Nursing Facility1

## 2024-02-27 ENCOUNTER — Ambulatory Visit (INDEPENDENT_AMBULATORY_CARE_PROVIDER_SITE_OTHER): Admitting: Licensed Clinical Social Worker

## 2024-02-27 DIAGNOSIS — F411 Generalized anxiety disorder: Secondary | ICD-10-CM

## 2024-02-27 NOTE — Progress Notes (Signed)
 Virtual Visit via Video Note  I connected with [name] on 02/27/24 at  3:00 PM EDT by a video enabled telemedicine application and verified that I am speaking with the correct person using two identifiers.  Location: Patient: Home Provider: Office   I discussed the limitations of evaluation and management by telemedicine and the availability of in person appointments. The patient expressed understanding and agreed to proceed.   I discussed the assessment and treatment plan with the patient. The patient was provided an opportunity to ask questions and all were answered. The patient agreed with the plan and demonstrated an understanding of the instructions.   The patient was advised to call back or seek an in-person evaluation if the symptoms worsen or if the condition fails to improve as anticipated.  I provided 45 minutes of non-face-to-face time during this encounter.   Fonda Conroy, LCSW   THERAPIST PROGRESS NOTE  Session Time: 3:00 pm-3:45 pm  Type of Therapy: Individual Therapy  Purpose of session/Treatment Goals addressed: [name] will manage mood and anxiety as evidenced by reducing and eliminating negative self talk, manage worry thoughts, process grief, and learn how to relax for 5 out of 7 days for 60 days.   Interventions: Therapist utilized CBT, ACT,  and Solution Focused brief therapy  to address anxiety. Therapist provided support and empathy to patient during session. Therapist worked with patient on managing worry thoughts and learn how to relax.    Effectiveness: Patient was oriented x4 (person, place, situation, and time) . Patient was  Appropriate. Patient was Casually dress. Patient noted that she has been feeling down and anxious. Patient has been staying in her home for the past 3 days. She hasn't felt like doing much. Patient was recently diagnosed with type 2 diabetes. She is working on identifying what she can control. She can't control what students she  gets in school but she can control how much extra that she does at school. Patient can't control if parents follow through on referrals, suggestions, etc but she can control if she gives them referrals. Patient is setting appropriate boundaries with school and focusing on her mental and physical health.   Patient engaged in session. Patient responded well to interventions. Patient continues to meet criteria for Generalized anxiety disorder  Patient will continue in outpatient therapy due to being the least restrictive service to meet her needs. Patient made minimal progress on her goals at this time.      12/21/2023    3:15 PM 03/17/2020    7:11 AM 12/31/2015    4:12 PM  Depression screen PHQ 2/9  Decreased Interest 2 1 0  Down, Depressed, Hopeless 1 3 0  PHQ - 2 Score 3 4 0  Altered sleeping 1 0   Tired, decreased energy 1 1   Change in appetite 2 1   Feeling bad or failure about yourself  3 2   Trouble concentrating 0 0   Moving slowly or fidgety/restless 0 2   Suicidal thoughts 0 0   PHQ-9 Score 10 10   Difficult doing work/chores  Somewhat difficult        12/21/2023    3:16 PM  GAD 7 : Generalized Anxiety Score  Nervous, Anxious, on Edge 1  Control/stop worrying 2  Worry too much - different things 2  Trouble relaxing 1  Restless 0  Easily annoyed or irritable 1  Afraid - awful might happen 1  Total GAD 7 Score 8  Anxiety Difficulty  Somewhat difficult        Suicidal/Homicidal:  No without intent/plan   Recent stressful life event and Widow/widower  Protective Factors: positive social support and coping skills  Plan: Patient will return in 2-4 weeks.   Diagnosis: Axis I: Generalized anxiety disorder   Collaboration of Care: Other [facility]  Patient/Guardian was advised Release of Information must be obtained prior to any record release in order to collaborate their care with an outside provider. Patient/Guardian was advised if they have not already  done so to contact the registration department to sign all necessary forms in order for us  to release information regarding their care.   Consent: Patient/Guardian gives verbal consent for treatment and assignment of benefits for services provided during this visit. Patient/Guardian expressed understanding and agreed to proceed.

## 2024-02-28 ENCOUNTER — Encounter: Payer: Self-pay | Admitting: *Deleted

## 2024-02-29 ENCOUNTER — Ambulatory Visit: Admitting: Adult Health

## 2024-03-01 ENCOUNTER — Ambulatory Visit (HOSPITAL_COMMUNITY): Admitting: Licensed Clinical Social Worker

## 2024-03-29 ENCOUNTER — Ambulatory Visit (HOSPITAL_COMMUNITY): Admitting: Licensed Clinical Social Worker

## 2024-05-23 NOTE — Telephone Encounter (Signed)
 LVM for patient to give [facility] a call back to get set get set up on cpap machine .

## 2024-06-04 ENCOUNTER — Telehealth: Payer: Self-pay | Admitting: *Deleted

## 2024-06-04 NOTE — Telephone Encounter (Signed)
 Received fax from [facility] that they have been unable to reach pt to schedule services with her despite multiple attempts.  They will be cancelling order and if pt wished to proceed at a later date then will be happy to reenter.
